# Patient Record
Sex: Male | Born: 1960 | Race: White | Hispanic: No | Marital: Married | Smoking: Current every day smoker
Health system: Southern US, Community
[De-identification: ages and names within clinical notes are randomized; demographics above are authoritative.]

## PROBLEM LIST (undated history)

## (undated) DIAGNOSIS — I10 Essential (primary) hypertension: Secondary | ICD-10-CM

## (undated) HISTORY — PX: BACK SURGERY: SHX140

## (undated) HISTORY — PX: CARDIOVASCULAR STRESS TEST: SHX262

---

## 2006-01-24 ENCOUNTER — Ambulatory Visit: Payer: Self-pay | Admitting: Unknown Physician Specialty

## 2006-02-27 ENCOUNTER — Ambulatory Visit (HOSPITAL_COMMUNITY): Admission: RE | Admit: 2006-02-27 | Discharge: 2006-02-28 | Payer: Self-pay | Admitting: Neurosurgery

## 2006-12-16 ENCOUNTER — Encounter: Admission: RE | Admit: 2006-12-16 | Discharge: 2006-12-16 | Payer: Self-pay | Admitting: Neurosurgery

## 2010-05-19 NOTE — Op Note (Signed)
NAMESIXTO, BOWDISH           ACCOUNT NO.:  [id number]   MEDICAL RECORD NO.:  [id number]          PATIENT TYPE:  AMB   LOCATION:  SDS                          FACILITY:  [facility]   PHYSICIAN:  Hewitt Shorts, M.D.DATE OF BIRTH:  24-Nov-1960   DATE OF PROCEDURE:  02/27/2006  DATE OF DISCHARGE:                               OPERATIVE REPORT   PREOPERATIVE DIAGNOSES:  Right L5-S1 lumbar disk herniation, lumbar  degenerative disk disease, lumbar spondylosis and lumbar radiculopathy.   POSTOPERATIVE DIAGNOSES:  Right L5-S1 lumbar disk herniation, lumbar  degenerative disk disease, lumbar spondylosis and lumbar radiculopathy.   PROCEDURE:  Right L5-S1 lumbar laminotomy and microdiskectomy with  microdissection.   SURGEON:  Hewitt Shorts, M.D.   ASSISTANTS:  Clydene Fake, M.D.  Russell L. Webb Silversmith, R.N.   ANESTHESIA:  General endotracheal.   INDICATIONS:  The patient is a 50 year old man, who presented with a  right lumbar radiculopathy.  He was found to have a right L5-S1 lumbar  disk herniation superimposed upon underlying degenerative disk disease  and spondylosis.  The decision was made to proceed with elective  laminotomy and microdiskectomy.   DESCRIPTION OF PROCEDURE:  The patient was brought to the operating room  and placed under general endotracheal anesthesia.  The patient was  turned to the prone position and the lumbar region was prepped with  Betadine Soap and Solution and draped in a sterile fashion.  The midline  was infiltrated with local anesthetic with epinephrine.  An x-ray was  taken.  The L5-S1 level was identified, and an incision was made over  the L5-S1 level and carried down through the subcutaneous tissue.  Bipolar cautery and electrocautery were used to maintain hemostasis.  Dissection was carried down to the lumbar fascia, which was incised on  the right side of the midline, and the paraspinal muscles were dissected  from the spinous  processes and laminae in a subperiosteal fashion.  A  self-retaining retractor was placed and an x-ray taken and the L5-S1  interlaminar space identified.  The microscope was draped and brought  into the field to provide additional magnification, illumination and  visualization, and the remainder of the decompression was performed  using microdissection and microsurgical technique.  Laminotomy was  performed using the X-Max drill and Kerrison punches.  The ligamentum  flavum was carefully removed.  There was a moderate amount of  spondylotic degeneration from the medial aspect of the L5-S1 facet.  This was carefully dissected from the thecal sac and exiting right S1  nerve root.  We were able to actually mobilize the thecal sac and nerve  root, and there was a disk herniation apparent within a reactive, thin  capsule ventral to the thecal sac and the nerve root.  This was  gradually mobilized and the disk began to extrude, and this was removed  in a piecemeal fashion, and we were able to remove the entire free  fragment of disk herniation with good decompression of the thecal sac  and the nerve root.  The hole in the annulus through which it had exited  had already closed over,  and we were able to free up the remainder of  the reactive membrane.  It was coagulated and divided, and the thecal  sac and nerve root were free in the epidural space.  The diskectomy was  completed with all loose fragments of disk material removed from the  epidural space, and once the diskectomy was completed, hemostasis was  established with the use of Gelfoam soaked in thrombin, as well as  bipolar cautery.  We removed all of the Gelfoam from the epidural space  and laminotomy, and it was felt that hemostasis continued.  And then we  instilled 2 mL of fentanyl and 80 mg of Depo-Medrol into the epidural  space and then proceeded with closure.  The deep fascia was closed with  interrupted, undyed 1 Vicryl  sutures.  Scarpa fascia was closed with  interrupted, undyed, inverted Vicryl sutures, and the subcutaneous,  subcuticular layers closed with interrupted, inverted 2-0 undyed Vicryl  sutures.  The skin edges were approximated with Dermabond.  The  procedure was tolerated well.  The estimated blood loss was 25 mL.  Sponge counts were correct.  Following surgery, the patient was turned  back to a supine position to be reversed from the anesthetic, extubated  and transferred to the recovery room for further care.      Hewitt Shorts, M.D.  Electronically Signed     RWN/MEDQ  D:  02/27/2006  T:  02/27/2006  Job:  [id number]

## 2015-03-11 DIAGNOSIS — E7800 Pure hypercholesterolemia, unspecified: Secondary | ICD-10-CM | POA: Insufficient documentation

## 2015-03-11 DIAGNOSIS — I10 Essential (primary) hypertension: Secondary | ICD-10-CM | POA: Insufficient documentation

## 2015-03-11 DIAGNOSIS — Z720 Tobacco use: Secondary | ICD-10-CM | POA: Insufficient documentation

## 2015-03-14 ENCOUNTER — Telehealth: Payer: Self-pay | Admitting: Gastroenterology

## 2015-03-14 NOTE — Telephone Encounter (Signed)
colonoscopy

## 2015-03-18 ENCOUNTER — Other Ambulatory Visit: Payer: Self-pay

## 2015-03-18 NOTE — Telephone Encounter (Signed)
Gastroenterology Pre-Procedure Review  Request Date: 05/1915 Requesting Physician: Dr. Doy Hutching  PATIENT REVIEW QUESTIONS: The patient responded to the following health history questions as indicated:    1. Are you having any GI issues? no 2. Do you have a personal history of Polyps? no 3. Do you have a family history of Colon Cancer or Polyps? no 4. Diabetes Mellitus? no 5. Joint replacements in the past 12 months?no 6. Major health problems in the past 3 months?no 7. Any artificial heart valves, MVP, or defibrillator?no    MEDICATIONS & ALLERGIES:    Patient reports the following regarding taking any anticoagulation/antiplatelet therapy:   Plavix, Coumadin, Eliquis, Xarelto, Lovenox, Pradaxa, Brilinta, or Effient? no Aspirin? yes (ASA 81mg )  Patient confirms/reports the following medications:  No current outpatient prescriptions on file.   No current facility-administered medications for this visit.    Patient confirms/reports the following allergies:  Allergies not on file  No orders of the defined types were placed in this encounter.    AUTHORIZATION INFORMATION Primary Insurance: 1D#: Group #:  Secondary Insurance: 1D#: Group #:  SCHEDULE INFORMATION: Date: 05/20/15 Time: Location: [place]

## 2015-03-18 NOTE — Telephone Encounter (Signed)
Pt scheduled for a screening colonoscopy at [facility] on 05/20/15. Instructs/rx mailed. Please precert.

## 2015-06-01 NOTE — Discharge Instructions (Signed)

## 2015-06-03 ENCOUNTER — Ambulatory Visit: Payer: BLUE CROSS/BLUE SHIELD | Admitting: Anesthesiology

## 2015-06-03 ENCOUNTER — Ambulatory Visit
Admission: RE | Admit: 2015-06-03 | Discharge: 2015-06-03 | Disposition: A | Discharge status: Home / Self Care | Payer: BLUE CROSS/BLUE SHIELD | Source: Ambulatory Visit | Attending: Gastroenterology | Admitting: Gastroenterology

## 2015-06-03 ENCOUNTER — Encounter
Admission: RE | Disposition: A | Discharge status: Home / Self Care | Payer: Self-pay | Source: Ambulatory Visit | Attending: Gastroenterology

## 2015-06-03 DIAGNOSIS — Z7982 Long term (current) use of aspirin: Secondary | ICD-10-CM | POA: Insufficient documentation

## 2015-06-03 DIAGNOSIS — Z1211 Encounter for screening for malignant neoplasm of colon: Secondary | ICD-10-CM

## 2015-06-03 DIAGNOSIS — Z79899 Other long term (current) drug therapy: Secondary | ICD-10-CM | POA: Insufficient documentation

## 2015-06-03 DIAGNOSIS — D123 Benign neoplasm of transverse colon: Secondary | ICD-10-CM

## 2015-06-03 DIAGNOSIS — K5730 Diverticulosis of large intestine without perforation or abscess without bleeding: Secondary | ICD-10-CM | POA: Insufficient documentation

## 2015-06-03 DIAGNOSIS — F17210 Nicotine dependence, cigarettes, uncomplicated: Secondary | ICD-10-CM | POA: Insufficient documentation

## 2015-06-03 DIAGNOSIS — K640 First degree hemorrhoids: Secondary | ICD-10-CM | POA: Diagnosis not present

## 2015-06-03 DIAGNOSIS — Z9889 Other specified postprocedural states: Secondary | ICD-10-CM | POA: Insufficient documentation

## 2015-06-03 DIAGNOSIS — I10 Essential (primary) hypertension: Secondary | ICD-10-CM | POA: Diagnosis not present

## 2015-06-03 HISTORY — PX: POLYPECTOMY: SHX5525

## 2015-06-03 HISTORY — PX: COLONOSCOPY WITH PROPOFOL: SHX5780

## 2015-06-03 HISTORY — DX: Essential (primary) hypertension: I10

## 2015-06-03 SURGERY — COLONOSCOPY WITH PROPOFOL
Anesthesia: Monitor Anesthesia Care

## 2015-06-03 MED ORDER — SIMETHICONE 40 MG/0.6ML PO SUSP
ORAL | Status: DC | PRN
  Administered 2015-06-03: 09:00:00

## 2015-06-03 MED ORDER — PROPOFOL 10 MG/ML IV BOLUS
INTRAVENOUS | Status: DC | PRN
  Administered 2015-06-03: 100 mg via INTRAVENOUS
  Administered 2015-06-03: 30 mg via INTRAVENOUS
  Administered 2015-06-03: 50 mg via INTRAVENOUS
  Administered 2015-06-03: 20 mg via INTRAVENOUS
  Administered 2015-06-03: 50 mg via INTRAVENOUS

## 2015-06-03 MED ORDER — LACTATED RINGERS IV SOLN
INTRAVENOUS | Status: DC
  Administered 2015-06-03: 08:00:00 via INTRAVENOUS

## 2015-06-03 MED ORDER — LIDOCAINE HCL (CARDIAC) 20 MG/ML IV SOLN
INTRAVENOUS | Status: DC | PRN
  Administered 2015-06-03: 40 mg via INTRAVENOUS

## 2015-06-03 SURGICAL SUPPLY — 22 items
CANISTER SUCT 1200ML W/VALVE (MISCELLANEOUS) ×4 IMPLANT
CLIP HMST 235XBRD CATH ROT (MISCELLANEOUS) IMPLANT
CLIP RESOLUTION 360 11X235 (MISCELLANEOUS)
FCP ESCP3.2XJMB 240X2.8X (MISCELLANEOUS)
FORCEPS BIOP RAD 4 LRG CAP 4 (CUTTING FORCEPS) ×2 IMPLANT
FORCEPS BIOP RJ4 240 W/NDL (MISCELLANEOUS)
FORCEPS ESCP3.2XJMB 240X2.8X (MISCELLANEOUS) IMPLANT
GOWN CVR UNV OPN BCK APRN NK (MISCELLANEOUS) ×4 IMPLANT
GOWN ISOL THUMB LOOP REG UNIV (MISCELLANEOUS) ×8
INJECTOR VARIJECT VIN23 (MISCELLANEOUS) IMPLANT
KIT DEFENDO VALVE AND CONN (KITS) IMPLANT
KIT ENDO PROCEDURE OLY (KITS) ×4 IMPLANT
MARKER SPOT ENDO TATTOO 5ML (MISCELLANEOUS) IMPLANT
PAD GROUND ADULT SPLIT (MISCELLANEOUS) IMPLANT
PROBE APC STR FIRE (PROBE) IMPLANT
SNARE SHORT THROW 13M SML OVAL (MISCELLANEOUS) IMPLANT
SNARE SHORT THROW 30M LRG OVAL (MISCELLANEOUS) IMPLANT
SNARE SNG USE RND 15MM (INSTRUMENTS) IMPLANT
SPOT EX ENDOSCOPIC TATTOO (MISCELLANEOUS)
TRAP ETRAP POLY (MISCELLANEOUS) IMPLANT
VARIJECT INJECTOR VIN23 (MISCELLANEOUS)
WATER STERILE IRR 250ML POUR (IV SOLUTION) ×4 IMPLANT

## 2015-06-03 NOTE — Anesthesia Procedure Notes (Signed)
Procedure Name: MAC Performed by: Alyha Marines Pre-anesthesia Checklist: Patient identified, Emergency Drugs available, Suction available, Patient being monitored and Timeout performed Patient Re-evaluated:Patient Re-evaluated prior to inductionOxygen Delivery Method: Nasal cannula       

## 2015-06-03 NOTE — Op Note (Signed)
[facility] Gastroenterology Patient Name: Norman Weaver Procedure Date: 06/03/2015 8:51 AM MRN: [id number] Account #: [id number] Date of Birth: Mar 13, 1960 Admit Type: Outpatient Age: 55 Room: [facility] OR ROOM 01 Gender: Male Note Status: Finalized Procedure:            Colonoscopy Indications:          Screening for colorectal malignant neoplasm Providers:            Lucilla Lame, MD Referring MD:         Leonie Douglas. Doy Hutching, MD (Referring MD) Medicines:            Propofol per Anesthesia Complications:        No immediate complications. Procedure:            Pre-Anesthesia Assessment:                       - Prior to the procedure, a History and Physical was                        performed, and patient medications and allergies were                        reviewed. The patient's tolerance of previous                        anesthesia was also reviewed. The risks and benefits of                        the procedure and the sedation options and risks were                        discussed with the patient. All questions were                        answered, and informed consent was obtained. Prior                        Anticoagulants: The patient has taken no previous                        anticoagulant or antiplatelet agents. ASA Grade                        Assessment: II - A patient with mild systemic disease.                        After reviewing the risks and benefits, the patient was                        deemed in satisfactory condition to undergo the                        procedure.                       After obtaining informed consent, the colonoscope was                        passed under direct vision. Throughout the procedure,  the patient's blood pressure, pulse, and oxygen                        saturations were monitored continuously. The Olympus CF                        H180AL colonoscope (S#: [id number]) was introduced  through                        the anus and advanced to the the cecum, identified by                        appendiceal orifice and ileocecal valve. The                        colonoscopy was performed without difficulty. The                        patient tolerated the procedure well. The quality of                        the bowel preparation was excellent. Findings:      The perianal and digital rectal examinations were normal.      A 3 mm polyp was found in the transverse colon. The polyp was sessile.       The polyp was removed with a cold biopsy forceps. Resection and       retrieval were complete.      Non-bleeding internal hemorrhoids were found during retroflexion. The       hemorrhoids were Grade I (internal hemorrhoids that do not prolapse).      A few small-mouthed diverticula were found in the sigmoid colon. Impression:           - One 3 mm polyp in the transverse colon, removed with                        a cold biopsy forceps. Resected and retrieved.                       - Non-bleeding internal hemorrhoids.                       - Diverticulosis in the sigmoid colon. Recommendation:       - Await pathology results.                       - Repeat colonoscopy in 5 years if polyp adenoma and 10                        years if hyperplastic Procedure Code(s):    --- Professional ---                       [phone], Colonoscopy, flexible; with biopsy, single or                        multiple Diagnosis Code(s):    --- Professional ---                       Z12.11, Encounter for screening for malignant neoplasm  of colon                       D12.3, Benign neoplasm of transverse colon (hepatic                        flexure or splenic flexure) CPT copyright 2016 American Medical Association. All rights reserved. The codes documented in this report are preliminary and upon coder review may  be revised to meet current compliance requirements. Lucilla Lame,  MD 06/03/2015 9:10:07 AM This report has been signed electronically. Number of Addenda: 0 Note Initiated On: 06/03/2015 8:51 AM Scope Withdrawal Time: 0 hours 6 minutes 27 seconds  Total Procedure Duration: 0 hours 9 minutes 34 seconds       [facility]

## 2015-06-03 NOTE — Anesthesia Preprocedure Evaluation (Addendum)
Anesthesia Evaluation  Patient identified by MRN, date of birth, ID band Patient awake    Reviewed: Allergy & Precautions, NPO status , Patient's Chart, lab work & pertinent test results  Airway Mallampati: II  TM Distance: >3 FB Neck ROM: Full    Dental   Pulmonary neg pulmonary ROS, Current Smoker,    Pulmonary exam normal        Cardiovascular hypertension, negative cardio ROS Normal cardiovascular exam     Neuro/Psych negative neurological ROS  negative psych ROS   GI/Hepatic negative GI ROS, Neg liver ROS,   Endo/Other  negative endocrine ROS  Renal/GU negative Renal ROS  negative genitourinary   Musculoskeletal negative musculoskeletal ROS (+)   Abdominal   Peds negative pediatric ROS (+)  Hematology negative hematology ROS (+)   Anesthesia Other Findings   Reproductive/Obstetrics negative OB ROS                            Anesthesia Physical Anesthesia Plan  ASA: II  Anesthesia Plan: MAC   Post-op Pain Management:    Induction: Intravenous  Airway Management Planned:   Additional Equipment:   Intra-op Plan:   Post-operative Plan:   Informed Consent: I have reviewed the patients History and Physical, chart, labs and discussed the procedure including the risks, benefits and alternatives for the proposed anesthesia with the patient or authorized representative who has indicated his/her understanding and acceptance.     Plan Discussed with: CRNA  Anesthesia Plan Comments:         Anesthesia Quick Evaluation

## 2015-06-03 NOTE — Transfer of Care (Signed)
Immediate Anesthesia Transfer of Care Note  Patient: Norman Weaver  Procedure(s) Performed: Procedure(s): COLONOSCOPY WITH PROPOFOL (N/A) POLYPECTOMY  Patient Location: PACU  Anesthesia Type: MAC  Level of Consciousness: awake, alert  and patient cooperative  Airway and Oxygen Therapy: Patient Spontanous Breathing and Patient connected to supplemental oxygen  Post-op Assessment: Post-op Vital signs reviewed, Patient's Cardiovascular Status Stable, Respiratory Function Stable, Patent Airway and No signs of Nausea or vomiting  Post-op Vital Signs: Reviewed and stable  Complications: No apparent anesthesia complications

## 2015-06-03 NOTE — H&P (Signed)
  [facility]  [address], [place], [place] [address] Phone: [phone] Fax : [phone]  Primary Care Physician:  No primary care provider on file. Primary Gastroenterologist:  Dr. Allen Norris  Pre-Procedure History & Physical: HPI:  Norman Weaver is a 55 y.o. male is here for a screening colonoscopy.   Past Medical History  Diagnosis Date  . Hypertension     Past Surgical History  Procedure Laterality Date  . Back surgery    . Cardiovascular stress test  approx 2014    Prior to Admission medications   Medication Sig Start Date End Date Taking? Authorizing Provider  aspirin 81 MG tablet Take 81 mg by mouth daily.   Yes Historical Provider, MD  lisinopril-hydrochlorothiazide (PRINZIDE,ZESTORETIC) 20-25 MG tablet 1 EVERY MORNING 03/04/15  Yes Historical Provider, MD  simvastatin (ZOCOR) 10 MG tablet Take 10 mg by mouth every evening. 03/04/15  Yes Historical Provider, MD    Allergies as of 03/18/2015  . (No Known Allergies)    History reviewed. No pertinent family history.  Social History   Social History  . Marital Status: Married    Spouse Name: N/A  . Number of Children: N/A  . Years of Education: N/A   Occupational History  . Not on file.   Social History Main Topics  . Smoking status: Current Every Day Smoker -- 1.00 packs/day  . Smokeless tobacco: Current User  . Alcohol Use: 1.8 - 3.6 oz/week    3-6 Cans of beer per week     Comment: daily  . Drug Use: No  . Sexual Activity: Not on file   Other Topics Concern  . Not on file   Social History Narrative    Review of Systems: See HPI, otherwise negative ROS  Physical Exam: BP 137/78 mmHg  Pulse 85  Temp(Src) 98.1 F (36.7 C) (Temporal)  Ht 5\' 9"  (1.753 m)  Wt 190 lb (86.183 kg)  BMI 28.05 kg/m2  SpO2 100% General:   Alert,  pleasant and cooperative in NAD Head:  Normocephalic and atraumatic. Neck:  Supple; no masses or thyromegaly. Lungs:  Clear throughout to  auscultation.    Heart:  Regular rate and rhythm. Abdomen:  Soft, nontender and nondistended. Normal bowel sounds, without guarding, and without rebound.   Neurologic:  Alert and  oriented x4;  grossly normal neurologically.  Impression/Plan: Norman Weaver is now here to undergo a screening colonoscopy.  Risks, benefits, and alternatives regarding colonoscopy have been reviewed with the patient.  Questions have been answered.  All parties agreeable.

## 2015-06-03 NOTE — Anesthesia Postprocedure Evaluation (Signed)
Anesthesia Post Note  Patient: Norman Weaver  Procedure(s) Performed: Procedure(s) (LRB): COLONOSCOPY WITH PROPOFOL (N/A) POLYPECTOMY  Patient location during evaluation: PACU Anesthesia Type: General Level of consciousness: awake and alert Pain management: pain level controlled Vital Signs Assessment: post-procedure vital signs reviewed and stable Respiratory status: spontaneous breathing, nonlabored ventilation, respiratory function stable and patient connected to nasal cannula oxygen Cardiovascular status: blood pressure returned to baseline and stable Postop Assessment: no signs of nausea or vomiting Anesthetic complications: no    Marshell Levan

## 2015-06-06 ENCOUNTER — Encounter: Payer: Self-pay | Admitting: Gastroenterology

## 2015-06-07 ENCOUNTER — Encounter: Payer: Self-pay | Admitting: Gastroenterology

## 2020-01-27 DIAGNOSIS — M7742 Metatarsalgia, left foot: Secondary | ICD-10-CM | POA: Insufficient documentation

## 2020-03-22 ENCOUNTER — Ambulatory Visit: Payer: Self-pay | Admitting: Surgery

## 2020-03-22 NOTE — H&P (View-Only) (Signed)
Subjective:  CC: Peri-rectal abscess [K61.1]   HPI:  Norman Weaver is a 59 y.o. male who was referrred by Jeffrey D Sparks, MD for above. Symptoms were first noted 5 days ago.Pain is sharp.  Improved slightly with prep H and sitz baths.  Lump initially there, but improved now.  No bleeding or drainage. Exacerbated by pressure.  Past Medical History:  has a past medical history of Hypertension.  Past Surgical History:  has a past surgical history that includes back surgery.  Family History: family history includes Lung cancer in his mother; Melanoma in his brother; Myocardial Infarction (Heart attack) in his sister.  Social History:  reports that he has been smoking cigarettes. He has never used smokeless tobacco. He reports current alcohol use. He reports that he does not use drugs.  Current Medications: has a current medication list which includes the following prescription(s): aspirin and lisinopril-hydrochlorothiazide.  Allergies:  Allergies as of 03/22/2020  . (No Known Allergies)    ROS:  A 15 point review of systems was performed and pertinent positives and negatives noted in HPI  Objective:     BP 130/79   Pulse 97   Ht 174 cm (5' 8.5")   Wt 97.5 kg (215 lb)   BMI 32.22 kg/m   Constitutional :  alert, appears stated age, cooperative and no distress  Lymphatics/Throat::  no asymmetry, masses, or scars  Respiratory:  clear to auscultation bilaterally  Cardiovascular:  regular rate and rhythm  Gastrointestinal: soft, non-tender; bowel sounds normal; no masses,  no organomegaly.    Musculoskeletal: Steady gait and movement  Skin: Cool and moist  Psychiatric: Normal affect, non-agitated, not confused  Genital/Rectal: Chaperone present for exam.  External exam noted to have likely perirectal abscess at posterior aspect.  DRE revealed, normal rectal tone, with no obvious hemorrhoid. After obtaining verbal consent, an anoscope was inserted after prepped with lubricant and  confirmed No other pathology such as fissures, fistulas, polyps noted.  Scope withdrawn and patient tolerate procedure well.     LABS:  n/a   RADS: n/a Assessment:     Peri-rectal abscess [K61.1]  Plan:    1. Peri-rectal abscess [K61.1] Discussed risks/benefits/alternatives to I&D.  Alternatives include the options of observation, medical management.  Benefits include symptomatic relief.  I discussed  in detail and the complications related to the operation and the anesthesia, including bleeding, infection, recurrence, remote possibility of temporary or permanent fecal incontinence, poor/delayed wound healing, chronic pain, and additional procedures to address said risks. The risks of general anesthetic, if used, includes MI, CVA, sudden death or even reaction to anesthetic medications also discussed.   We also discussed typical post operative recovery which includes weeks to potentially months of anal pain, drainage, occasional bleeding, and sense of fecal urgency.    ED return precautions given for sudden increase in pain, bleeding, with possible accompanying fever, nausea, and/or vomiting.  The patient understands the risks, any and all questions were answered to the patient's satisfaction.  2. Patient has elected to proceed with surgical treatment. Procedure will be scheduled in a couple days due to recent aspirin use.  Will start abx in the meantime.  If abx significantly improves, we can postpone I&D.  Written consent was obtained.  

## 2020-03-22 NOTE — H&P (Signed)
Subjective:  CC: Peri-rectal abscess [K61.1]   HPI:  Norman Weaver is a 60 y.o. male who was referrred by Idelle Crouch, MD for above. Symptoms were first noted 5 days ago.Pain is sharp.  Improved slightly with prep H and sitz baths.  Lump initially there, but improved now.  No bleeding or drainage. Exacerbated by pressure.  Past Medical History:  has a past medical history of Hypertension.  Past Surgical History:  has a past surgical history that includes back surgery.  Family History: family history includes Lung cancer in his mother; Melanoma in his brother; Myocardial Infarction (Heart attack) in his sister.  Social History:  reports that he has been smoking cigarettes. He has never used smokeless tobacco. He reports current alcohol use. He reports that he does not use drugs.  Current Medications: has a current medication list which includes the following prescription(s): aspirin and lisinopril-hydrochlorothiazide.  Allergies:  Allergies as of 03/22/2020  . (No Known Allergies)    ROS:  A 15 point review of systems was performed and pertinent positives and negatives noted in HPI  Objective:     BP 130/79   Pulse 97   Ht 174 cm (5' 8.5")   Wt 97.5 kg (215 lb)   BMI 32.22 kg/m   Constitutional :  alert, appears stated age, cooperative and no distress  Lymphatics/Throat::  no asymmetry, masses, or scars  Respiratory:  clear to auscultation bilaterally  Cardiovascular:  regular rate and rhythm  Gastrointestinal: soft, non-tender; bowel sounds normal; no masses,  no organomegaly.    Musculoskeletal: Steady gait and movement  Skin: Cool and moist  Psychiatric: Normal affect, non-agitated, not confused  Genital/Rectal: Chaperone present for exam.  External exam noted to have likely perirectal abscess at posterior aspect.  DRE revealed, normal rectal tone, with no obvious hemorrhoid. After obtaining verbal consent, an anoscope was inserted after prepped with lubricant and  confirmed No other pathology such as fissures, fistulas, polyps noted.  Scope withdrawn and patient tolerate procedure well.     LABS:  n/a   RADS: n/a Assessment:     Peri-rectal abscess [K61.1]  Plan:    1. Peri-rectal abscess [K61.1] Discussed risks/benefits/alternatives to I&D.  Alternatives include the options of observation, medical management.  Benefits include symptomatic relief.  I discussed  in detail and the complications related to the operation and the anesthesia, including bleeding, infection, recurrence, remote possibility of temporary or permanent fecal incontinence, poor/delayed wound healing, chronic pain, and additional procedures to address said risks. The risks of general anesthetic, if used, includes MI, CVA, sudden death or even reaction to anesthetic medications also discussed.   We also discussed typical post operative recovery which includes weeks to potentially months of anal pain, drainage, occasional bleeding, and sense of fecal urgency.    ED return precautions given for sudden increase in pain, bleeding, with possible accompanying fever, nausea, and/or vomiting.  The patient understands the risks, any and all questions were answered to the patient's satisfaction.  2. Patient has elected to proceed with surgical treatment. Procedure will be scheduled in a couple days due to recent aspirin use.  Will start abx in the meantime.  If abx significantly improves, we can postpone I&D.  Written consent was obtained.

## 2020-03-23 ENCOUNTER — Other Ambulatory Visit
Admission: RE | Admit: 2020-03-23 | Discharge: 2020-03-23 | Disposition: A | Discharge status: Home / Self Care | Payer: BC Managed Care – PPO | Source: Ambulatory Visit | Attending: Surgery | Admitting: Surgery

## 2020-03-23 ENCOUNTER — Other Ambulatory Visit: Payer: Self-pay

## 2020-03-23 DIAGNOSIS — Z20822 Contact with and (suspected) exposure to covid-19: Secondary | ICD-10-CM | POA: Insufficient documentation

## 2020-03-23 DIAGNOSIS — K612 Anorectal abscess: Secondary | ICD-10-CM | POA: Diagnosis not present

## 2020-03-23 DIAGNOSIS — F17200 Nicotine dependence, unspecified, uncomplicated: Secondary | ICD-10-CM | POA: Diagnosis not present

## 2020-03-23 DIAGNOSIS — K640 First degree hemorrhoids: Secondary | ICD-10-CM | POA: Diagnosis not present

## 2020-03-23 DIAGNOSIS — Z01812 Encounter for preprocedural laboratory examination: Secondary | ICD-10-CM | POA: Insufficient documentation

## 2020-03-23 DIAGNOSIS — Z8249 Family history of ischemic heart disease and other diseases of the circulatory system: Secondary | ICD-10-CM | POA: Diagnosis not present

## 2020-03-23 DIAGNOSIS — I10 Essential (primary) hypertension: Secondary | ICD-10-CM | POA: Diagnosis not present

## 2020-03-23 LAB — SARS CORONAVIRUS 2 (TAT 6-24 HRS): SARS Coronavirus 2: NEGATIVE

## 2020-03-24 ENCOUNTER — Ambulatory Visit: Payer: BC Managed Care – PPO | Admitting: Anesthesiology

## 2020-03-24 ENCOUNTER — Encounter: Payer: Self-pay | Admitting: Surgery

## 2020-03-24 ENCOUNTER — Ambulatory Visit
Admission: RE | Admit: 2020-03-24 | Discharge: 2020-03-24 | Disposition: A | Discharge status: Home / Self Care | Payer: BC Managed Care – PPO | Attending: Surgery | Admitting: Surgery

## 2020-03-24 ENCOUNTER — Encounter
Admission: RE | Disposition: A | Discharge status: Home / Self Care | Payer: Self-pay | Source: Home / Self Care | Attending: Surgery

## 2020-03-24 DIAGNOSIS — K612 Anorectal abscess: Secondary | ICD-10-CM | POA: Insufficient documentation

## 2020-03-24 DIAGNOSIS — K640 First degree hemorrhoids: Secondary | ICD-10-CM | POA: Insufficient documentation

## 2020-03-24 DIAGNOSIS — F17200 Nicotine dependence, unspecified, uncomplicated: Secondary | ICD-10-CM | POA: Insufficient documentation

## 2020-03-24 DIAGNOSIS — Z20822 Contact with and (suspected) exposure to covid-19: Secondary | ICD-10-CM | POA: Insufficient documentation

## 2020-03-24 DIAGNOSIS — Z8249 Family history of ischemic heart disease and other diseases of the circulatory system: Secondary | ICD-10-CM | POA: Insufficient documentation

## 2020-03-24 DIAGNOSIS — K611 Rectal abscess: Secondary | ICD-10-CM

## 2020-03-24 DIAGNOSIS — I10 Essential (primary) hypertension: Secondary | ICD-10-CM | POA: Insufficient documentation

## 2020-03-24 HISTORY — PX: INCISION AND DRAINAGE ABSCESS: SHX5864

## 2020-03-24 SURGERY — INCISION AND DRAINAGE, ABSCESS
Anesthesia: General | Site: Rectum

## 2020-03-24 MED ORDER — DEXAMETHASONE SODIUM PHOSPHATE 10 MG/ML IJ SOLN
INTRAMUSCULAR | Status: AC
  Filled 2020-03-24: qty 1

## 2020-03-24 MED ORDER — ONDANSETRON HCL 4 MG/2ML IJ SOLN
INTRAMUSCULAR | Status: DC | PRN
  Administered 2020-03-24: 4 mg via INTRAVENOUS

## 2020-03-24 MED ORDER — ONDANSETRON HCL 4 MG/2ML IJ SOLN
INTRAMUSCULAR | Status: AC
  Filled 2020-03-24: qty 2

## 2020-03-24 MED ORDER — LACTATED RINGERS IV SOLN: INTRAVENOUS | Status: DC

## 2020-03-24 MED ORDER — GABAPENTIN 300 MG PO CAPS: 300.0000 mg | ORAL_CAPSULE | ORAL | Status: AC

## 2020-03-24 MED ORDER — MIDAZOLAM HCL 2 MG/2ML IJ SOLN
INTRAMUSCULAR | Status: AC
  Filled 2020-03-24: qty 2

## 2020-03-24 MED ORDER — HYDROCODONE-ACETAMINOPHEN 7.5-325 MG PO TABS: 1.0000 | ORAL_TABLET | Freq: Once | ORAL | Status: DC | PRN

## 2020-03-24 MED ORDER — CELECOXIB 200 MG PO CAPS
ORAL_CAPSULE | ORAL | Status: AC
  Administered 2020-03-24: 200 mg via ORAL
  Filled 2020-03-24: qty 1

## 2020-03-24 MED ORDER — DEXMEDETOMIDINE (PRECEDEX) IN NS 20 MCG/5ML (4 MCG/ML) IV SYRINGE
PREFILLED_SYRINGE | INTRAVENOUS | Status: DC | PRN
  Administered 2020-03-24 (×2): 4 ug via INTRAVENOUS

## 2020-03-24 MED ORDER — FENTANYL CITRATE (PF) 100 MCG/2ML IJ SOLN: 25.0000 ug | INTRAMUSCULAR | Status: DC | PRN

## 2020-03-24 MED ORDER — PHENYLEPHRINE HCL (PRESSORS) 10 MG/ML IV SOLN
INTRAVENOUS | Status: DC | PRN
  Administered 2020-03-24 (×2): 100 ug via INTRAVENOUS
  Administered 2020-03-24 (×2): 50 ug via INTRAVENOUS

## 2020-03-24 MED ORDER — LIDOCAINE HCL (PF) 2 % IJ SOLN
INTRAMUSCULAR | Status: AC
  Filled 2020-03-24: qty 5

## 2020-03-24 MED ORDER — GABAPENTIN 300 MG PO CAPS
ORAL_CAPSULE | ORAL | Status: AC
  Administered 2020-03-24: 300 mg via ORAL
  Filled 2020-03-24: qty 1

## 2020-03-24 MED ORDER — PROPOFOL 10 MG/ML IV BOLUS
INTRAVENOUS | Status: DC | PRN
  Administered 2020-03-24: 200 mg via INTRAVENOUS

## 2020-03-24 MED ORDER — ACETAMINOPHEN 500 MG PO TABS: 1000.0000 mg | ORAL_TABLET | ORAL | Status: AC

## 2020-03-24 MED ORDER — PROMETHAZINE HCL 25 MG/ML IJ SOLN: 6.2500 mg | INTRAMUSCULAR | Status: DC | PRN

## 2020-03-24 MED ORDER — MIDAZOLAM HCL 2 MG/2ML IJ SOLN
INTRAMUSCULAR | Status: DC | PRN
  Administered 2020-03-24: 2 mg via INTRAVENOUS

## 2020-03-24 MED ORDER — PROPOFOL 10 MG/ML IV BOLUS
INTRAVENOUS | Status: AC
  Filled 2020-03-24: qty 20

## 2020-03-24 MED ORDER — CHLORHEXIDINE GLUCONATE 0.12 % MT SOLN
15.0000 mL | Freq: Once | OROMUCOSAL | Status: AC
  Administered 2020-03-24: 15 mL via OROMUCOSAL

## 2020-03-24 MED ORDER — FENTANYL CITRATE (PF) 100 MCG/2ML IJ SOLN
INTRAMUSCULAR | Status: DC | PRN
  Administered 2020-03-24: 50 ug via INTRAVENOUS
  Administered 2020-03-24 (×2): 25 ug via INTRAVENOUS

## 2020-03-24 MED ORDER — LIDOCAINE HCL 1 % IJ SOLN
INTRAMUSCULAR | Status: DC | PRN
  Administered 2020-03-24: 4 mL

## 2020-03-24 MED ORDER — ACETAMINOPHEN 160 MG/5ML PO SOLN
325.0000 mg | ORAL | Status: DC | PRN
  Filled 2020-03-24: qty 20.3

## 2020-03-24 MED ORDER — CHLORHEXIDINE GLUCONATE 0.12 % MT SOLN
OROMUCOSAL | Status: AC
  Filled 2020-03-24: qty 15

## 2020-03-24 MED ORDER — KETOROLAC TROMETHAMINE 30 MG/ML IJ SOLN: 30.0000 mg | Freq: Once | INTRAMUSCULAR | Status: DC | PRN

## 2020-03-24 MED ORDER — CELECOXIB 200 MG PO CAPS: 200.0000 mg | ORAL_CAPSULE | ORAL | Status: AC

## 2020-03-24 MED ORDER — CHLORHEXIDINE GLUCONATE CLOTH 2 % EX PADS: 6.0000 | MEDICATED_PAD | Freq: Once | CUTANEOUS | Status: DC

## 2020-03-24 MED ORDER — ACETAMINOPHEN 500 MG PO TABS
ORAL_TABLET | ORAL | Status: AC
  Administered 2020-03-24: 1000 mg via ORAL
  Filled 2020-03-24: qty 2

## 2020-03-24 MED ORDER — ORAL CARE MOUTH RINSE: 15.0000 mL | Freq: Once | OROMUCOSAL | Status: AC

## 2020-03-24 MED ORDER — ACETAMINOPHEN 325 MG PO TABS: 325.0000 mg | ORAL_TABLET | ORAL | Status: DC | PRN

## 2020-03-24 MED ORDER — DEXAMETHASONE SODIUM PHOSPHATE 10 MG/ML IJ SOLN
INTRAMUSCULAR | Status: DC | PRN
  Administered 2020-03-24: 10 mg via INTRAVENOUS

## 2020-03-24 MED ORDER — BUPIVACAINE-EPINEPHRINE 0.25% -1:200000 IJ SOLN
INTRAMUSCULAR | Status: DC | PRN
  Administered 2020-03-24: 4 mL

## 2020-03-24 MED ORDER — FENTANYL CITRATE (PF) 100 MCG/2ML IJ SOLN
INTRAMUSCULAR | Status: AC
  Filled 2020-03-24: qty 2

## 2020-03-24 MED ORDER — LIDOCAINE HCL (CARDIAC) PF 100 MG/5ML IV SOSY
PREFILLED_SYRINGE | INTRAVENOUS | Status: DC | PRN
  Administered 2020-03-24: 100 mg via INTRAVENOUS

## 2020-03-24 SURGICAL SUPPLY — 29 items
APL PRP STRL LF DISP 70% ISPRP (MISCELLANEOUS) ×1
BLADE SURG 15 STRL LF DISP TIS (BLADE) ×1 IMPLANT
BLADE SURG 15 STRL SS (BLADE) ×2
CANISTER SUCT 1200ML W/VALVE (MISCELLANEOUS) ×1 IMPLANT
CHLORAPREP W/TINT 26 (MISCELLANEOUS) ×2 IMPLANT
COVER WAND RF STERILE (DRAPES) ×2 IMPLANT
DRAPE LAPAROTOMY 100X77 ABD (DRAPES) ×2 IMPLANT
ELECT REM PT RETURN 9FT ADLT (ELECTROSURGICAL) ×2
ELECTRODE REM PT RTRN 9FT ADLT (ELECTROSURGICAL) ×1 IMPLANT
GAUZE PACKING IODOFORM 1/2 (PACKING) IMPLANT
GAUZE SPONGE 4X4 12PLY STRL (GAUZE/BANDAGES/DRESSINGS) ×2 IMPLANT
GLOVE SURG SYN 6.5 ES PF (GLOVE) ×6 IMPLANT
GLOVE SURG SYN 6.5 PF PI (GLOVE) ×2 IMPLANT
GLOVE SURG UNDER POLY LF SZ7 (GLOVE) ×4 IMPLANT
GOWN STRL REUS W/ TWL LRG LVL3 (GOWN DISPOSABLE) ×2 IMPLANT
GOWN STRL REUS W/TWL LRG LVL3 (GOWN DISPOSABLE) ×6
LABEL OR SOLS (LABEL) ×2 IMPLANT
MANIFOLD NEPTUNE II (INSTRUMENTS) ×2 IMPLANT
NEEDLE HYPO 22GX1.5 SAFETY (NEEDLE) ×2 IMPLANT
NS IRRIG 500ML POUR BTL (IV SOLUTION) ×2 IMPLANT
PACK BASIN MINOR ARMC (MISCELLANEOUS) ×2 IMPLANT
SUT MNCRL 4-0 (SUTURE)
SUT MNCRL 4-0 27XMFL (SUTURE)
SUT VIC AB 2-0 CT2 27 (SUTURE) ×1 IMPLANT
SUT VIC AB 3-0 SH 27 (SUTURE)
SUT VIC AB 3-0 SH 27X BRD (SUTURE) ×1 IMPLANT
SUTURE MNCRL 4-0 27XMF (SUTURE) ×1 IMPLANT
SYR 10ML LL (SYRINGE) ×2 IMPLANT
TOWEL OR 17X26 4PK STRL BLUE (TOWEL DISPOSABLE) ×1 IMPLANT

## 2020-03-24 NOTE — Interval H&P Note (Signed)
History and Physical Interval Note:  03/24/2020 9:54 AM  Norman Weaver  has presented today for surgery, with the diagnosis of Peri-rectal abscess K61.1.    Notified by patient last night about the abscess spontaneously draining.  Reinspection of the area in office noted a ulcerated area with active drainage, tenderness to palpation, and an adequate examination at an office bedside to definitively confirm adequate drainage as well as absence of fistula.  Due to the concern this may become a recurrent issue for fistula was the cause of this abscess, I still recommended proceeding with exam under anesthesia in the operating room, debridement of the abscess cavity  The various methods of treatment have been discussed with the patient and family. After consideration of risks, benefits and other options for treatment, the patient has consented to  Procedure(s): INCISION AND DRAINAGE ABSCESS (N/A) as a surgical intervention.  The patient's history has been reviewed, patient examined, no change in status, stable for surgery.  I have reviewed the patient's chart and labs.  Questions were answered to the patient's satisfaction.     Oluwaseun Cremer Lysle Pearl

## 2020-03-24 NOTE — Anesthesia Preprocedure Evaluation (Signed)
Anesthesia Evaluation  Patient identified by MRN, date of birth, ID band Patient awake    Reviewed: Allergy & Precautions, H&P , NPO status , reviewed documented beta blocker date and time   Airway Mallampati: II  TM Distance: >3 FB Neck ROM: full    Dental  (+) Caps   Pulmonary Current Smoker and Patient abstained from smoking.,    Pulmonary exam normal        Cardiovascular hypertension, Normal cardiovascular exam     Neuro/Psych    GI/Hepatic neg GERD  ,Rare GERD, Rolaids as needed, no sx's today or during last week   Endo/Other    Renal/GU      Musculoskeletal   Abdominal   Peds  Hematology   Anesthesia Other Findings Past Medical History: No date: Hypertension Past Surgical History: No date: BACK SURGERY approx 2014: CARDIOVASCULAR STRESS TEST 06/03/2015: COLONOSCOPY WITH PROPOFOL; N/A     Comment:  Procedure: COLONOSCOPY WITH PROPOFOL;  Surgeon: Lucilla Lame, MD;  Location: [facility];  Service:               Endoscopy;  Laterality: N/A; 06/03/2015: POLYPECTOMY     Comment:  Procedure: POLYPECTOMY;  Surgeon: Lucilla Lame, MD;                Location: [place];  Service: Endoscopy;; BMI    Body Mass Index: 31.01 kg/m     Reproductive/Obstetrics                             Anesthesia Physical Anesthesia Plan  ASA: II  Anesthesia Plan: General   Post-op Pain Management:    Induction: Intravenous  PONV Risk Score and Plan: 1 and Ondansetron, Treatment may vary due to age or medical condition and Midazolam  Airway Management Planned: LMA  Additional Equipment:   Intra-op Plan:   Post-operative Plan: Extubation in OR  Informed Consent: I have reviewed the patients History and Physical, chart, labs and discussed the procedure including the risks, benefits and alternatives for the proposed anesthesia with the patient or authorized  representative who has indicated his/her understanding and acceptance.     Dental Advisory Given  Plan Discussed with: CRNA  Anesthesia Plan Comments:         Anesthesia Quick Evaluation

## 2020-03-24 NOTE — Transfer of Care (Signed)
Immediate Anesthesia Transfer of Care Note  Patient: Norman Weaver  Procedure(s) Performed: INCISION AND DRAINAGE ABSCESS (N/A Rectum)  Patient Location: PACU  Anesthesia Type:General  Level of Consciousness: awake, alert  and oriented  Airway & Oxygen Therapy: Patient Spontanous Breathing and Patient connected to face mask oxygen  Post-op Assessment: Report given to RN and Post -op Vital signs reviewed and stable  Post vital signs: Reviewed and stable  Last Vitals:  Vitals Value Taken Time  BP 112/68 03/24/20 1103  Temp 36.6 C 03/24/20 1103  Pulse 77 03/24/20 1105  Resp 15 03/24/20 1105  SpO2 100 % 03/24/20 1105  Vitals shown include unvalidated device data.  Last Pain:  Vitals:   03/24/20 1004  PainSc: 2          Complications: No complications documented.

## 2020-03-24 NOTE — Op Note (Signed)
Preoperative diagnosis: Anal abscess Postoperative diagnosis: Anal abscess, grade 1 internal hemorrhoid  Procedure: Exam under anesthesia, debridement of anal abscess  Anesthesia: LMA  Surgeon: Lysle Pearl  Wound Classification: Clean Contaminated  Specimen: None  Complications: None  EBL: 3 mL  Preoperative wound measurement: 3 cm x 2 cm x 3.5 cm deep Postoperative wound measurement: 3 cm x 2 cm x 3.5 cm deep Amount of debrided tissue: 6 cm  Indications:  Patient is a 60 y.o. male with clinical exam concerning for an anal abscess.  Here today for formal exam under anesthesia.  See H&P for further details.  Description of procedure: The patient was taken to the operating room and placed in high lithotomy position.  LMA anesthesia was induced without any difficulty. A time-out was completed verifying correct patient, procedure, site, positioning, and implant(s) and/or special equipment prior to beginning this procedure.  Digital rectal exam noted no obvious indurated tissue or palpable fistulous tracts within the rectum.  He did have some grade 1 internal hemorrhoids.  Visualization within the lumen shortly afterwards confirmed the above findings.    Overlying necrotic tissue of the anal abscess cavity adjacent to the anal verge was removed with cautery.  Probing of the abscess cavity noted no extensive tunneling or additional tracks, but was adjacent to the rectal tissue.  Cavity itself remained within the subcutaneous layer and did not involve any muscle.  Hydrogen peroxide was then instilled into the abscess cavity while monitoring for any bubbling within the rectum.  No obvious bubbling was noted within the rectum confirming no fistulous tracts were present at the time of exam.     After confirming hemostasis, wound irrigated, packed with half inch iodoform packing and then cover with 4 x 4's, secured with mesh undergarments.  The patient tolerated the procedure well and  taken to the  postoperative care unit in stable condition.  Sponge and instrument count was correct at the end of the procedure.

## 2020-03-24 NOTE — Discharge Instructions (Addendum)
INCISION AND DRAINAGE, Care After This sheet gives you information about how to care for yourself after your procedure. Your health care provider may also give you more specific instructions. If you have problems or questions, contact your health care provider. What can I expect after the procedure? After the procedure, it is common to have:  Soreness.  Bruising.  Itching. Follow these instructions at home: site care Follow instructions from your health care provider about how to take care of your site. Make sure you:  Wash your hands with soap and water before and after you change your bandage (dressing). If soap and water are not available, use hand sanitizer.  Leave packing in wound for 48hrs, then ok to remove.  Sitz baths twice a day until wound heals.  Use sanitary pads as needed for the occasional bloody or clear discharge.  If the area bleeds or bruises, apply gentle pressure for 10 minutes.  OK TO SHOWER IN 24HRS  Check your site every day for signs of infection. Check for:  Redness, swelling, or pain.  Access thick brown fluid or blood.  Warmth.  Pus or a bad smell.  General instructions . Okay to return activities as tolerated. Marland Kitchen  tylenol and advil as needed for discomfort.  Please alternate between the two every four hours as needed for pain.   .  Use narcotics, if prescribed, only when tylenol and motrin is not enough to control pain. .  325-650mg  every 8hrs to max of 3000mg /24hrs (including the 325mg  in every norco dose) for the tylenol.   .  Advil up to 800mg  per dose every 8hrs as needed for pain.    Keep all follow-up visits as told by your health care provider. This is important. Contact a health care provider if:  You have redness, swelling, or pain around your site.  You have fluid or blood coming from your site.  Your site feels warm to the touch.  You have pus or a bad smell coming from your site.  You have a fever.  Your sutures, skin glue,  or adhesive strips loosen or come off sooner than expected. Get help right away if:  You have bleeding that does not stop with pressure or a dressing. Summary  After the procedure, it is common to have some soreness, bruising, and itching at the site.  Follow instructions from your health care provider about how to take care of your site.  Check your site every day for signs of infection.  Contact a health care provider if you have redness, swelling, or pain around your site, or your site feels warm to the touch.  Keep all follow-up visits as told by your health care provider. This is important. This information is not intended to replace advice given to you by your health care provider. Make sure you discuss any questions you have with your health care provider. Document Released: 01/14/2015 Document Revised: 06/17/2017 Document Reviewed: 06/17/2017 Elsevier Interactive Patient Education  2019 [employer]   1) The drugs that you were given will stay in your system until tomorrow so for the next 24 hours you should not:  A) Drive an automobile B) Make any legal decisions C) Drink any alcoholic beverage   2) You may resume regular meals tomorrow.  Today it is better to start with liquids and gradually work up to solid foods.  You may eat anything you prefer, but it is better to start with liquids,  then soup and crackers, and gradually work up to solid foods.   3) Please notify your doctor immediately if you have any unusual bleeding, trouble breathing, redness and pain at the surgery site, drainage, fever, or pain not relieved by medication.    4) Additional Instructions:   Keep green band on for 4 days   Please contact your physician with any problems or Same Day Surgery at [phone], Monday through Friday 6 am to 4 pm, or [place] at [facility] number at [phone].

## 2020-03-24 NOTE — Anesthesia Procedure Notes (Signed)
Procedure Name: LMA Insertion Date/Time: 03/24/2020 10:29 AM Performed by: Lily Peer, Felica Chargois, CRNA Pre-anesthesia Checklist: Patient identified, Emergency Drugs available, Suction available and Patient being monitored Patient Re-evaluated:Patient Re-evaluated prior to induction Oxygen Delivery Method: Circle system utilized Preoxygenation: Pre-oxygenation with 100% oxygen Induction Type: IV induction Ventilation: Mask ventilation without difficulty LMA: LMA inserted LMA Size: 4.0 Number of attempts: 1 Placement Confirmation: positive ETCO2 and breath sounds checked- equal and bilateral Tube secured with: Tape Dental Injury: Teeth and Oropharynx as per pre-operative assessment

## 2020-03-31 NOTE — Anesthesia Postprocedure Evaluation (Signed)
Anesthesia Post Note  Patient: Norman Weaver  Procedure(s) Performed: INCISION AND DRAINAGE ABSCESS (N/A Rectum)  Patient location during evaluation: PACU Anesthesia Type: General Level of consciousness: awake and alert Pain management: pain level controlled Vital Signs Assessment: post-procedure vital signs reviewed and stable Respiratory status: spontaneous breathing, nonlabored ventilation and respiratory function stable Cardiovascular status: blood pressure returned to baseline and stable Postop Assessment: no apparent nausea or vomiting Anesthetic complications: no   No complications documented.   Last Vitals:  Vitals:   03/24/20 1140 03/24/20 1148  BP: 114/72 116/66  Pulse: 72 73  Resp: 15 16  Temp: 36.8 C 36.9 C  SpO2: 96% 98%    Last Pain:  Vitals:   03/24/20 1148  TempSrc: Oral  PainSc: 0-No pain                 Alphonsus Sias

## 2020-04-04 ENCOUNTER — Telehealth: Payer: Self-pay | Admitting: *Deleted

## 2020-04-04 NOTE — Telephone Encounter (Signed)
Received referral for low dose lung cancer screening CT scan. Message left at phone number listed in EMR for patient to call me back to facilitate scheduling scan.  

## 2020-04-05 ENCOUNTER — Encounter: Payer: Self-pay | Admitting: *Deleted

## 2020-04-05 ENCOUNTER — Telehealth: Payer: Self-pay | Admitting: *Deleted

## 2020-04-05 DIAGNOSIS — Z122 Encounter for screening for malignant neoplasm of respiratory organs: Secondary | ICD-10-CM

## 2020-04-05 DIAGNOSIS — Z87891 Personal history of nicotine dependence: Secondary | ICD-10-CM

## 2020-04-05 DIAGNOSIS — F17200 Nicotine dependence, unspecified, uncomplicated: Secondary | ICD-10-CM

## 2020-04-05 NOTE — Telephone Encounter (Signed)
Patient is currently an everyday smoker.

## 2020-04-05 NOTE — Telephone Encounter (Signed)
Received referral for initial lung cancer screening scan. Contacted patient and obtained smoking history,(1 ppd x 40 years) as well as answering questions related to screening process. Patient denies signs of lung cancer such as weight loss or hemoptysis. Patient denies comorbidity that would prevent curative treatment if lung cancer were found. Patient is scheduled for shared decision making visit and CT scan on 04/20/20 at 10:00am.

## 2020-04-20 ENCOUNTER — Ambulatory Visit
Admission: RE | Admit: 2020-04-20 | Discharge: 2020-04-20 | Disposition: A | Discharge status: Home / Self Care | Payer: BC Managed Care – PPO | Source: Ambulatory Visit | Attending: Oncology | Admitting: Oncology

## 2020-04-20 ENCOUNTER — Inpatient Hospital Stay: Payer: BC Managed Care – PPO | Attending: Oncology | Admitting: Oncology

## 2020-04-20 ENCOUNTER — Other Ambulatory Visit: Payer: Self-pay

## 2020-04-20 DIAGNOSIS — Z87891 Personal history of nicotine dependence: Secondary | ICD-10-CM | POA: Diagnosis not present

## 2020-04-20 DIAGNOSIS — F17200 Nicotine dependence, unspecified, uncomplicated: Secondary | ICD-10-CM | POA: Insufficient documentation

## 2020-04-20 DIAGNOSIS — Z122 Encounter for screening for malignant neoplasm of respiratory organs: Secondary | ICD-10-CM | POA: Insufficient documentation

## 2020-04-20 NOTE — Progress Notes (Signed)
Virtual Visit via Video Note  I connected with Mr. Debono on 04/20/20 at  9:45 AM EDT by a video enabled telemedicine application and verified that I am speaking with the correct person using two identifiers.  Location: Patient: [facility]   Provider: Clinic    I discussed the limitations of evaluation and management by telemedicine and the availability of in person appointments. The patient expressed understanding and agreed to proceed.  I discussed the assessment and treatment plan with the patient. The patient was provided an opportunity to ask questions and all were answered. The patient agreed with the plan and demonstrated an understanding of the instructions.   The patient was advised to call back or seek an in-person evaluation if the symptoms worsen or if the condition fails to improve as anticipated.   In accordance with CMS guidelines, patient has met eligibility criteria including age, absence of signs or symptoms of lung cancer.  Social History   Tobacco Use  . Smoking status: Current Every Day Smoker    Packs/day: 1.00    Years: 40.00    Pack years: 40.00  . Smokeless tobacco: Current User  Substance Use Topics  . Alcohol use: Yes    Alcohol/week: 3.0 - 6.0 standard drinks    Types: 3 - 6 Cans of beer per week    Comment: daily  . Drug use: No      A shared decision-making session was conducted prior to the performance of CT scan. This includes one or more decision aids, includes benefits and harms of screening, follow-up diagnostic testing, over-diagnosis, false positive rate, and total radiation exposure.   Counseling on the importance of adherence to annual lung cancer LDCT screening, impact of co-morbidities, and ability or willingness to undergo diagnosis and treatment is imperative for compliance of the program.   Counseling on the importance of continued smoking cessation for former smokers; the importance of smoking cessation for current smokers, and information  about tobacco cessation interventions have been given to patient including [place] and [facility] quit [place] programs.   Written order for lung cancer screening with LDCT has been given to the patient and any and all questions have been answered to the best of my abilities.    Yearly follow up will be coordinated by Burgess Estelle, Thoracic Navigator.  I provided 15 minutes of face-to-face video visit time during this encounter, and > 50% was spent counseling as documented under my assessment & plan.   Jacquelin Hawking, NP

## 2020-04-28 ENCOUNTER — Encounter: Payer: Self-pay | Admitting: *Deleted

## 2021-05-04 ENCOUNTER — Telehealth: Payer: Self-pay | Admitting: Acute Care

## 2021-05-04 NOTE — Telephone Encounter (Signed)
Attempted to reach pt to schedule LDCT-LVVM ?

## 2021-08-14 ENCOUNTER — Other Ambulatory Visit: Payer: Self-pay | Admitting: Internal Medicine

## 2021-08-14 DIAGNOSIS — F17210 Nicotine dependence, cigarettes, uncomplicated: Secondary | ICD-10-CM

## 2021-08-30 ENCOUNTER — Ambulatory Visit
Admission: RE | Admit: 2021-08-30 | Discharge: 2021-08-30 | Disposition: A | Discharge status: Home / Self Care | Payer: BC Managed Care – PPO | Source: Ambulatory Visit | Attending: Internal Medicine | Admitting: Internal Medicine

## 2021-08-30 DIAGNOSIS — F17210 Nicotine dependence, cigarettes, uncomplicated: Secondary | ICD-10-CM

## 2021-09-15 IMAGING — CT CT CHEST LUNG CANCER SCREENING LOW DOSE W/O CM
2 of 5 series · 15 of 40 positions shown, 18 images · non-contrast
Comparison: None.

CLINICAL DATA: 59-year-old male with 40 pack-year history of
smoking. Lung cancer screening.

EXAM:
CT CHEST WITHOUT CONTRAST LOW-DOSE FOR LUNG CANCER SCREENING
TECHNIQUE: Multidetector CT imaging of the chest was performed following the
standard protocol without IV contrast.

[Series 3: lung 1.00 · axial · 0.68mm/px · z∈[-1161,-878]mm · 12 of 313 slices shown, 15 images]
[im 15/313  mediastinal]
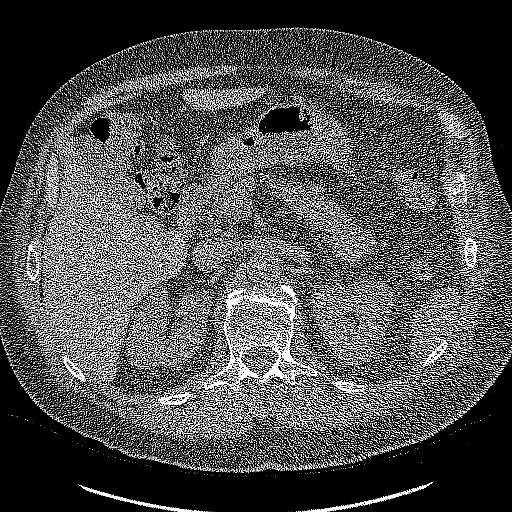
[im 15/313  lung]
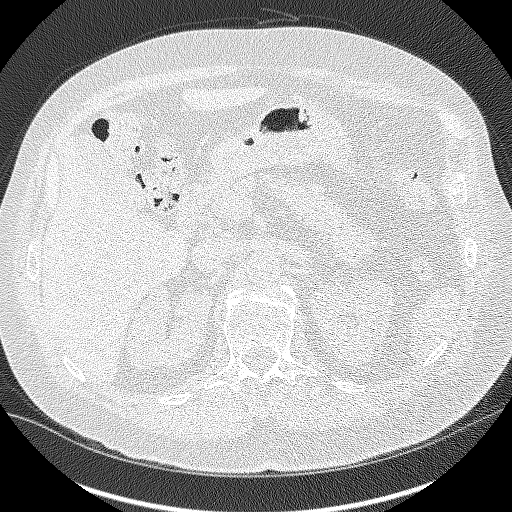
[im 45/313  lung]
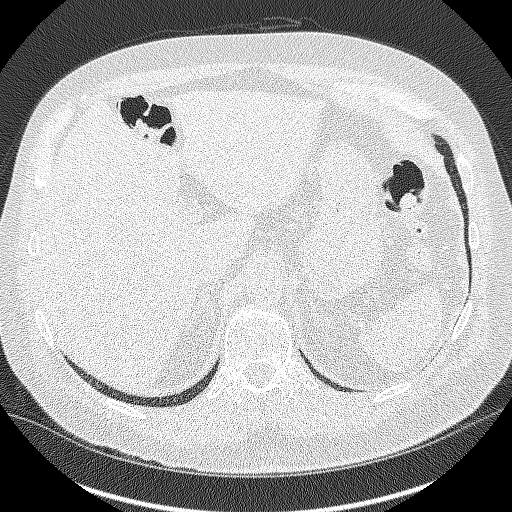
[im 75/313  lung]
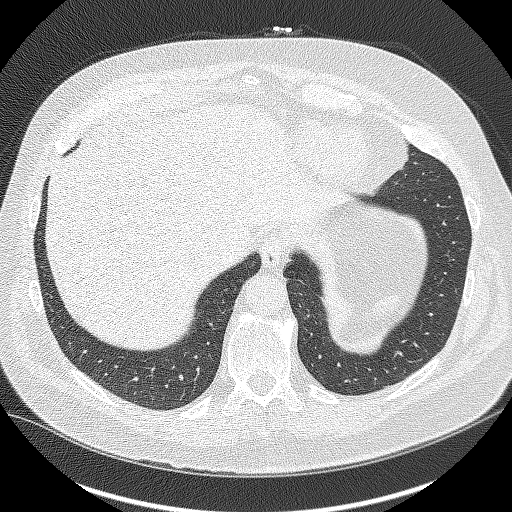
[im 90/313  lung]
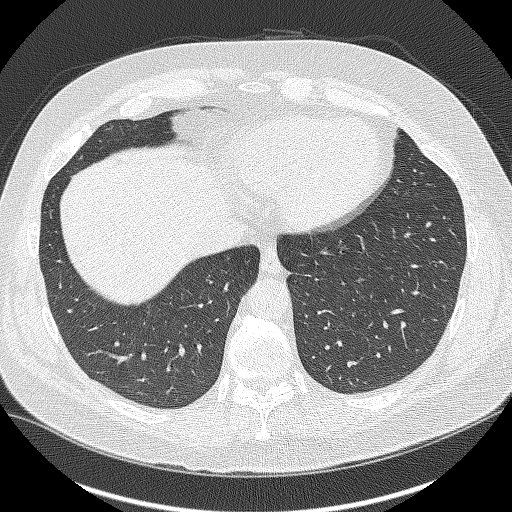
[im 119/313  mediastinal]
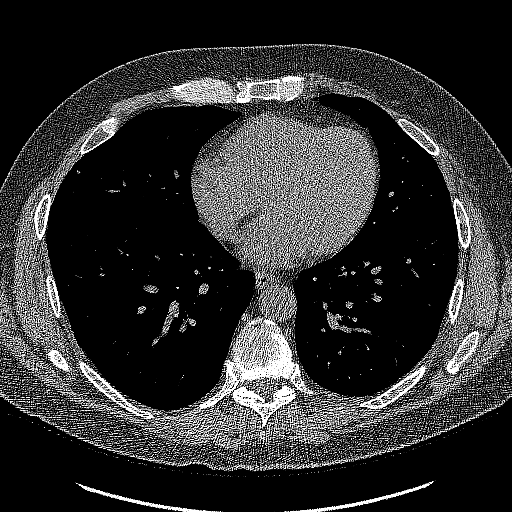
[im 119/313  lung]
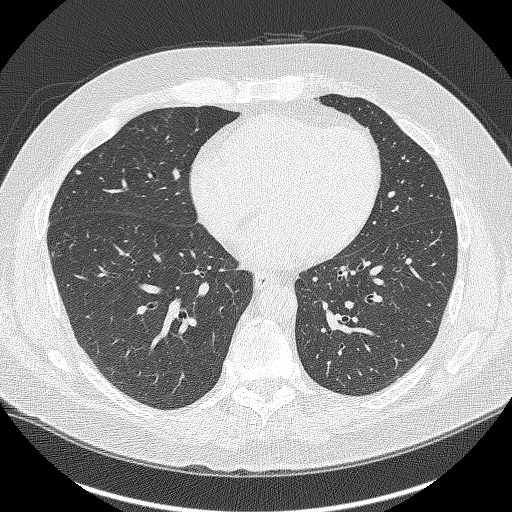
[im 149/313  lung]
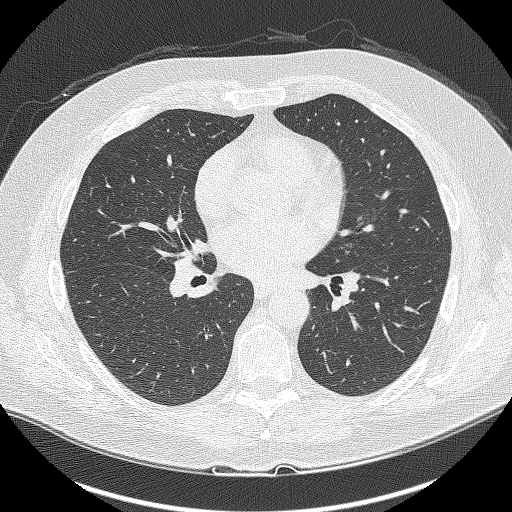
[im 164/313  lung]
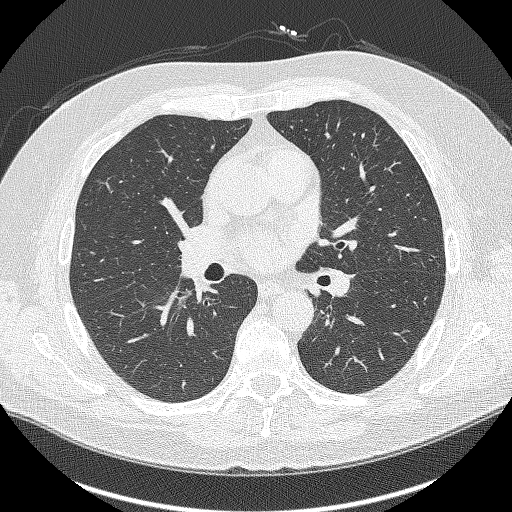
[im 194/313  lung]
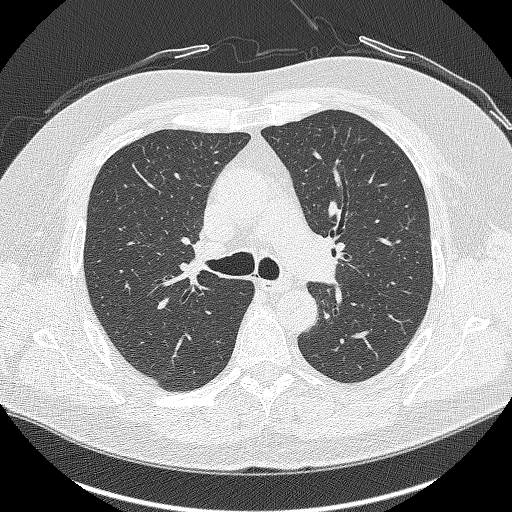
[im 223/313  mediastinal]
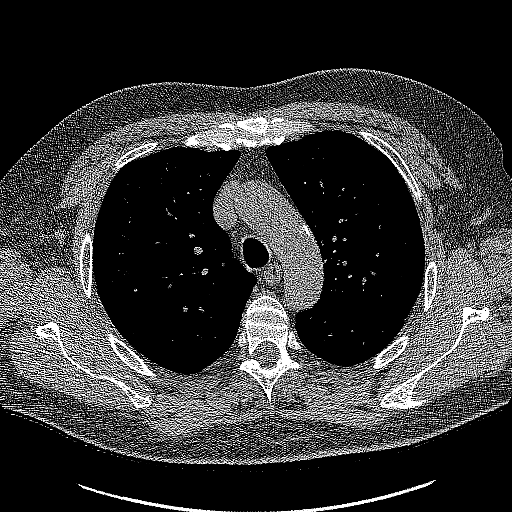
[im 223/313  lung]
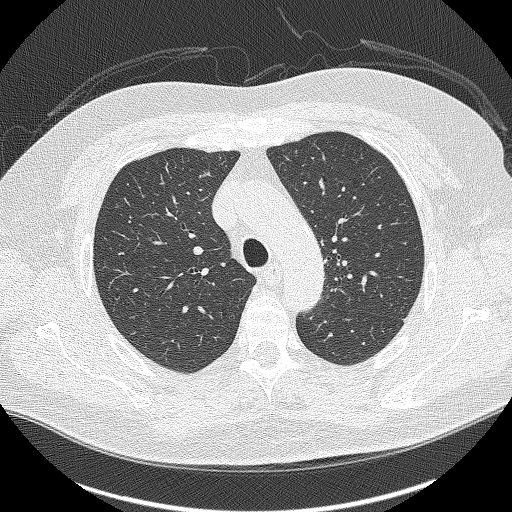
[im 238/313  lung]
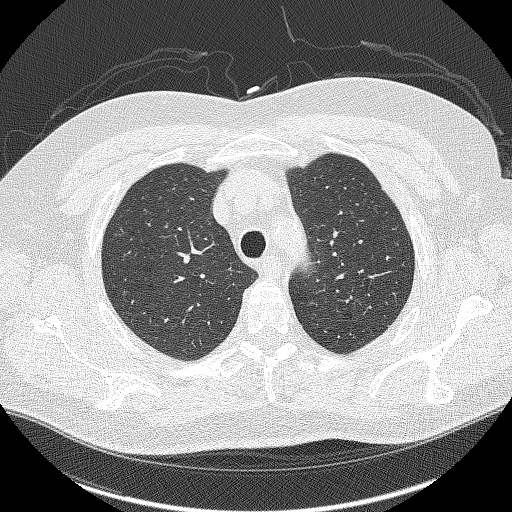
[im 268/313  lung]
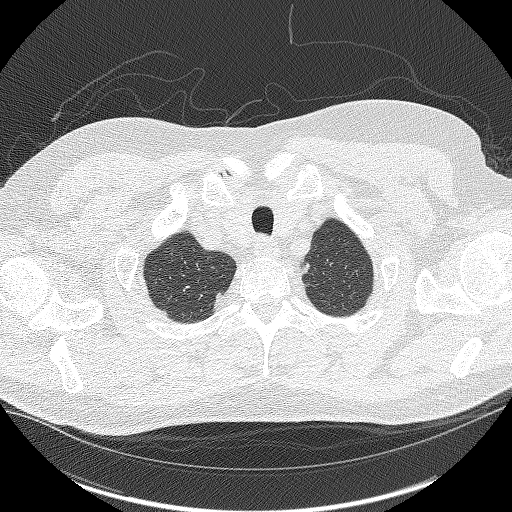
[im 298/313  lung]
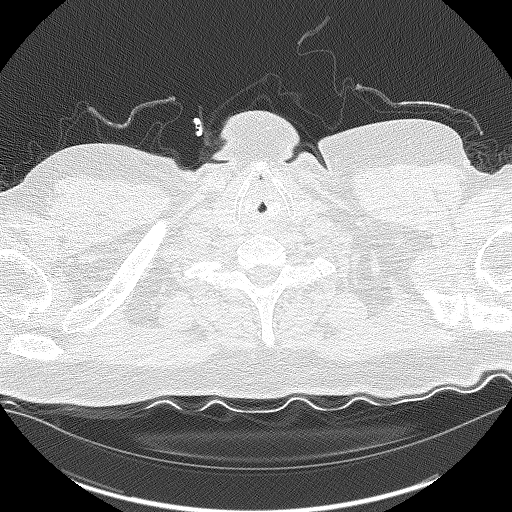

[Series 5: coronals lung 1.00 cor · coronal · 0.61mm/px · 3 of 301 slices shown]
[im 61/301  lung]
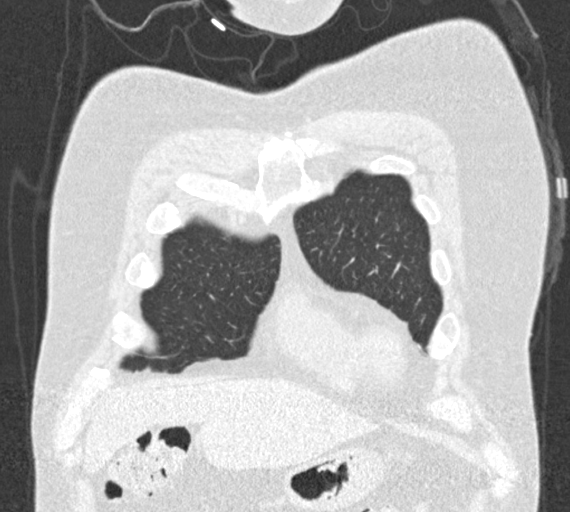
[im 121/301  lung]
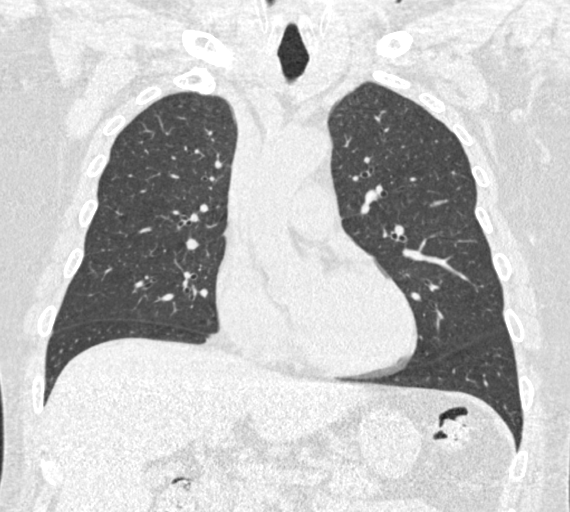
[im 181/301  lung]
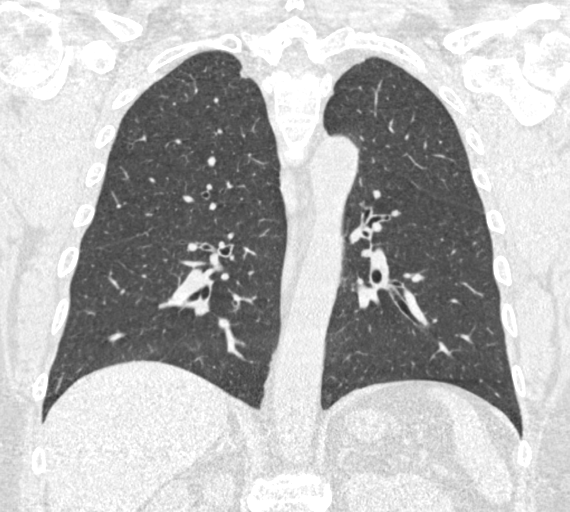

[15 of 40 positions shown; findings below may reference images not displayed]

FINDINGS: Cardiovascular: The heart size is normal. No substantial pericardial
effusion. Coronary artery calcification is evident. Atherosclerotic
calcification is noted in the wall of the thoracic aorta.

Mediastinum/Nodes: No mediastinal lymphadenopathy. No evidence for
gross hilar lymphadenopathy although assessment is limited by the
lack of intravenous contrast on today's study. The esophagus has
normal imaging features. There is no axillary lymphadenopathy.

Lungs/Pleura: Centrilobular emphsyema noted. Tiny scattered
bilateral pulmonary nodules are identified ranging in size from
mm up to 4.0 mm. No overtly suspicious nodule or mass on today's
study. No focal airspace consolidation. No pleural effusion.

Upper Abdomen: Unremarkable.

Musculoskeletal: No worrisome lytic or sclerotic osseous
abnormality.
IMPRESSION: 1. Lung-RADS 2, benign appearance or behavior. Continue annual
screening with low-dose chest CT without contrast in 12 months.
2.  Emphysema (YIWO7-3QA.1) and Aortic Atherosclerosis (YIWO7-170.0)

## 2021-10-11 ENCOUNTER — Encounter: Payer: Self-pay | Admitting: Podiatry

## 2021-10-11 ENCOUNTER — Ambulatory Visit: Payer: BC Managed Care – PPO | Admitting: Podiatry

## 2021-10-11 DIAGNOSIS — L600 Ingrowing nail: Secondary | ICD-10-CM

## 2021-10-11 DIAGNOSIS — M5412 Radiculopathy, cervical region: Secondary | ICD-10-CM | POA: Insufficient documentation

## 2021-10-11 DIAGNOSIS — M7550 Bursitis of unspecified shoulder: Secondary | ICD-10-CM | POA: Insufficient documentation

## 2021-10-11 MED ORDER — NEOMYCIN-POLYMYXIN-HC 1 % OT SOLN: OTIC | 1 refills | Status: AC

## 2021-10-11 NOTE — Progress Notes (Signed)
  Subjective:  Patient ID: Norman Weaver, male    DOB: 11-18-1960,  MRN: [id number] HPI Chief Complaint  Patient presents with   Toe Pain    Hallux bilateral - both borders, ingrown, right lateral border is red, swollen and very tender x 2 weeks, usually can trim out, kitten also grabbed the same corner with its claw-made worse, tried soaking salt water   New Patient (Initial Visit)    61 y.o. male presents with the above complaint.   ROS: Denies fever chills nausea vomit muscle aches pains calf pain back pain chest pain shortness of breath.  Past Medical History:  Diagnosis Date   Hypertension    Past Surgical History:  Procedure Laterality Date   BACK SURGERY     CARDIOVASCULAR STRESS TEST  approx 2014   COLONOSCOPY WITH PROPOFOL N/A 06/03/2015   Procedure: COLONOSCOPY WITH PROPOFOL;  Surgeon: Lucilla Lame, MD;  Location: [place];  Service: Endoscopy;  Laterality: N/A;   INCISION AND DRAINAGE ABSCESS N/A 03/24/2020   Procedure: INCISION AND DRAINAGE ABSCESS;  Surgeon: Benjamine Sprague, DO;  Location: [facility];  Service: General;  Laterality: N/A;   POLYPECTOMY  06/03/2015   Procedure: POLYPECTOMY;  Surgeon: Lucilla Lame, MD;  Location: [place];  Service: Endoscopy;;    Current Outpatient Medications:    NEOMYCIN-POLYMYXIN-HYDROCORTISONE (CORTISPORIN) 1 % SOLN OTIC solution, Apply 1-2 drops to toe BID after soaking, Disp: 10 mL, Rfl: 1   ibuprofen (ADVIL) 200 MG tablet, Take 600 mg by mouth every 6 (six) hours as needed (Inflammation)., Disp: , Rfl:    lisinopril-hydrochlorothiazide (PRINZIDE,ZESTORETIC) 20-25 MG tablet, Take 1 tablet by mouth daily., Disp: , Rfl: 0  No Known Allergies Review of Systems Objective:  There were no vitals filed for this visit.  General: Well developed, nourished, in no acute distress, alert and oriented x3   Dermatological: Skin is warm, dry and supple bilateral. Nails x 10 are well maintained; remaining integument appears  unremarkable at this time. There are no open sores, no preulcerative lesions, no rash or signs of infection present.  Sharp and abraded nail margin with erythema and abscess to the distal lateral aspect of the fibular border right.  Exquisitely tender on palpation.  Vascular: Dorsalis Pedis artery and Posterior Tibial artery pedal pulses are 2/4 bilateral with immedate capillary fill time. Pedal hair growth present. No varicosities and no lower extremity edema present bilateral.   Neruologic: Grossly intact via light touch bilateral. Vibratory intact via tuning fork bilateral. Protective threshold with Semmes Wienstein monofilament intact to all pedal sites bilateral. Patellar and Achilles deep tendon reflexes 2+ bilateral. No Babinski or clonus noted bilateral.   Musculoskeletal: No gross boney pedal deformities bilateral. No pain, crepitus, or limitation noted with foot and ankle range of motion bilateral. Muscular strength 5/5 in all groups tested bilateral.  Gait: Unassisted, Nonantalgic.    Radiographs:  Radiographs not taken  Assessment & Plan:   Assessment: Ingrown toenail fibular border hallux right  Plan: Chemical matricectomy fibular border hallux right.  He was given both oral and home-going instructions care and soaking of the toe as well as a prescription for Cortisporin Otic to be applied twice daily after soaking.  Follow-up with him in 2 to 3 weeks     Genni Buske T. [place], [place]

## 2021-10-11 NOTE — Patient Instructions (Signed)

## 2021-11-08 ENCOUNTER — Encounter: Payer: Self-pay | Admitting: Podiatry

## 2021-11-08 ENCOUNTER — Ambulatory Visit (INDEPENDENT_AMBULATORY_CARE_PROVIDER_SITE_OTHER): Payer: BC Managed Care – PPO | Admitting: Podiatry

## 2021-11-08 DIAGNOSIS — L600 Ingrowing nail: Secondary | ICD-10-CM

## 2021-11-08 DIAGNOSIS — Z9889 Other specified postprocedural states: Secondary | ICD-10-CM

## 2021-11-08 NOTE — Progress Notes (Signed)
He presents today for follow-up of his right hallux.  He states that is doing good is just a little tender still but much better than it was.  Objective: Vital signs are stable he is alert and oriented x3.  Pulses are palpable.  Hallux fibular border right demonstrates mild erythema no edema cellulitis drainage or odor no purulence on palpation.  Assessment: Well-healing matrixectomy hallux right.  Plan: Encouraged him to soak every other day Epsom salts and warm water until the tenderness has resolved.  Remember to ask how his mother-in-law was doing she had lung cancer that metastasized to her brain.

## 2022-07-19 ENCOUNTER — Other Ambulatory Visit: Payer: Self-pay | Admitting: Internal Medicine

## 2022-07-19 DIAGNOSIS — F17210 Nicotine dependence, cigarettes, uncomplicated: Secondary | ICD-10-CM

## 2022-07-19 DIAGNOSIS — Z122 Encounter for screening for malignant neoplasm of respiratory organs: Secondary | ICD-10-CM

## 2022-07-19 DIAGNOSIS — Z0000 Encounter for general adult medical examination without abnormal findings: Secondary | ICD-10-CM

## 2022-07-19 DIAGNOSIS — Z720 Tobacco use: Secondary | ICD-10-CM

## 2022-07-30 ENCOUNTER — Ambulatory Visit
Admission: RE | Admit: 2022-07-30 | Discharge: 2022-07-30 | Disposition: A | Discharge status: Home / Self Care | Payer: BC Managed Care – PPO | Source: Ambulatory Visit | Attending: Internal Medicine | Admitting: Internal Medicine

## 2022-07-30 DIAGNOSIS — F17210 Nicotine dependence, cigarettes, uncomplicated: Secondary | ICD-10-CM | POA: Diagnosis present

## 2022-07-30 DIAGNOSIS — Z720 Tobacco use: Secondary | ICD-10-CM | POA: Insufficient documentation

## 2022-07-30 DIAGNOSIS — Z0000 Encounter for general adult medical examination without abnormal findings: Secondary | ICD-10-CM | POA: Diagnosis present

## 2022-07-30 DIAGNOSIS — Z122 Encounter for screening for malignant neoplasm of respiratory organs: Secondary | ICD-10-CM | POA: Insufficient documentation

## 2022-10-12 ENCOUNTER — Ambulatory Visit
Admission: EM | Admit: 2022-10-12 | Discharge: 2022-10-12 | Disposition: A | Discharge status: Home / Self Care | Payer: BC Managed Care – PPO | Attending: Physician Assistant | Admitting: Physician Assistant

## 2022-10-12 ENCOUNTER — Encounter: Payer: Self-pay | Admitting: Emergency Medicine

## 2022-10-12 DIAGNOSIS — R051 Acute cough: Secondary | ICD-10-CM | POA: Diagnosis not present

## 2022-10-12 DIAGNOSIS — Z1152 Encounter for screening for COVID-19: Secondary | ICD-10-CM | POA: Diagnosis not present

## 2022-10-12 DIAGNOSIS — R0981 Nasal congestion: Secondary | ICD-10-CM | POA: Insufficient documentation

## 2022-10-12 DIAGNOSIS — J069 Acute upper respiratory infection, unspecified: Secondary | ICD-10-CM | POA: Insufficient documentation

## 2022-10-12 LAB — SARS CORONAVIRUS 2 BY RT PCR: SARS Coronavirus 2 by RT PCR: NEGATIVE

## 2022-10-12 NOTE — ED Triage Notes (Signed)
Patient reports runny nose, nasal congestion, cough and chest congestion that started 4 days ago.  Patient reports low grade fevers.

## 2022-10-12 NOTE — ED Provider Notes (Signed)
[facility]    CSN: [id number] Arrival date & time: 10/12/22  0959      History   Chief Complaint Chief Complaint  Patient presents with   Nasal Congestion   Cough    HPI Norman Weaver is a 62 y.o. male presenting for 4 day history of cough and congestion. Reports feeling feverish and recording a temp of 100.3 degrees yesterday. Also had a mild sore throat at onset. Denies fatigue, body aches, headaches, chest pain, wheezing, shortness of breath, diarrhea or vomiting. No sick contacts. Has been taking OTC allergy meds with improvement in symptoms.   HPI  Past Medical History:  Diagnosis Date   Hypertension     Patient Active Problem List   Diagnosis Date Noted   Bursitis of shoulder 10/11/2021   Cervical radiculitis 10/11/2021   Metatarsalgia of left foot 01/27/2020   Special screening for malignant neoplasms, colon    Benign neoplasm of transverse colon    Pure hypercholesterolemia 03/11/2015   HTN, goal below 140/80 03/11/2015   Current tobacco use 03/11/2015    Past Surgical History:  Procedure Laterality Date   BACK SURGERY     CARDIOVASCULAR STRESS TEST  approx 2014   COLONOSCOPY WITH PROPOFOL N/A 06/03/2015   Procedure: COLONOSCOPY WITH PROPOFOL;  Surgeon: Midge Minium, MD;  Location: [facility];  Service: Endoscopy;  Laterality: N/A;   INCISION AND DRAINAGE ABSCESS N/A 03/24/2020   Procedure: INCISION AND DRAINAGE ABSCESS;  Surgeon: Sung Amabile, DO;  Location: [facility];  Service: General;  Laterality: N/A;   POLYPECTOMY  06/03/2015   Procedure: POLYPECTOMY;  Surgeon: Midge Minium, MD;  Location: [facility];  Service: Endoscopy;;       Home Medications    Prior to Admission medications   Medication Sig Start Date End Date Taking? Authorizing Provider  lisinopril-hydrochlorothiazide (PRINZIDE,ZESTORETIC) 20-25 MG tablet Take 1 tablet by mouth daily. 03/04/15  Yes [provider]  omeprazole (PRILOSEC) 40 MG capsule  Take 40 mg by mouth daily. 12/20/21 12/20/22 Yes [provider]  ibuprofen (ADVIL) 200 MG tablet Take 600 mg by mouth every 6 (six) hours as needed (Inflammation).    [provider]  NEOMYCIN-POLYMYXIN-HYDROCORTISONE (CORTISPORIN) 1 % SOLN OTIC solution Apply 1-2 drops to toe BID after soaking 10/11/21   Hyatt, Max T, DPM    Family History History reviewed. No pertinent family history.  Social History Social History   Tobacco Use   Smoking status: Every Day    Current packs/day: 1.00    Average packs/day: 1 pack/day for 40.0 years (40.0 ttl pk-yrs)    Types: Cigarettes   Smokeless tobacco: Current  Vaping Use   Vaping status: Never Used  Substance Use Topics   Alcohol use: Yes    Alcohol/week: 3.0 - 6.0 standard drinks of alcohol    Types: 3 - 6 Cans of beer per week    Comment: daily   Drug use: No     Allergies   Patient has no known allergies.   Review of Systems Review of Systems  Constitutional:  Positive for fatigue and fever.  HENT:  Positive for congestion and rhinorrhea. Negative for sinus pressure, sinus pain and sore throat.   Respiratory:  Positive for cough. Negative for shortness of breath.   Cardiovascular:  Negative for chest pain.  Gastrointestinal:  Negative for abdominal pain, diarrhea, nausea and vomiting.  Musculoskeletal:  Negative for myalgias.  Neurological:  Negative for weakness, light-headedness and headaches.  Hematological:  Negative  for adenopathy.     Physical Exam Triage Vital Signs ED Triage Vitals  Encounter Vitals Group     BP      Systolic BP Percentile      Diastolic BP Percentile      Pulse      Resp      Temp      Temp src      SpO2      Weight      Height      Head Circumference      Peak Flow      Pain Score      Pain Loc      Pain Education      Exclude from Growth Chart    No data found.  Updated Vital Signs BP (!) 141/85 (BP Location: Left Arm)   Pulse 78   Temp 98.9 F (37.2 C)  (Oral)   Resp 15   Ht 5' 9.5" (1.765 m)   Wt 210 lb (95.3 kg)   SpO2 100%   BMI 30.57 kg/m     Physical Exam Vitals and nursing note reviewed.  Constitutional:      General: He is not in acute distress.    Appearance: Normal appearance. He is well-developed. He is not ill-appearing.  HENT:     Head: Normocephalic and atraumatic.     Nose: Congestion present.     Mouth/Throat:     Mouth: Mucous membranes are moist.     Pharynx: Oropharynx is clear. Posterior oropharyngeal erythema present.  Eyes:     General: No scleral icterus.    Conjunctiva/sclera: Conjunctivae normal.  Cardiovascular:     Rate and Rhythm: Normal rate and regular rhythm.     Heart sounds: Normal heart sounds.  Pulmonary:     Effort: Pulmonary effort is normal. No respiratory distress.     Breath sounds: Normal breath sounds.  Musculoskeletal:     Cervical back: Neck supple.  Skin:    General: Skin is warm and dry.     Capillary Refill: Capillary refill takes less than 2 seconds.  Neurological:     General: No focal deficit present.     Mental Status: He is alert. Mental status is at baseline.     Motor: No weakness.     Gait: Gait normal.  Psychiatric:        Mood and Affect: Mood normal.        Behavior: Behavior normal.      UC Treatments / Results  Labs (all labs ordered are listed, but only abnormal results are displayed) Labs Reviewed  SARS CORONAVIRUS 2 BY RT PCR    EKG   Radiology No results found.  Procedures Procedures (including critical care time)  Medications Ordered in UC Medications - No data to display  Initial Impression / Assessment and Plan / UC Course  I have reviewed the triage vital signs and the nursing notes.  Pertinent labs & imaging results that were available during my care of the patient were reviewed by me and considered in my medical decision making (see chart for details).   62 y/o male presents for 4 day history of low grade fever, cough and  congestion. Denies shortness of breath.   He is afebrile. Overall well appearing. Has mild nasal congestion and posterior pharyngeal erythema. Chest is CTA and heart RRR.   PCR COVID test obtained.  Negative.  Viral URI.  Offered prescription cough medication but he declined stating he will  continue with OTC meds.  Supportive care advised.  Explained that he may expect to be sick for a week or 2.  Reviewed return and ED precautions.   Final Clinical Impressions(s) / UC Diagnoses   Final diagnoses:  Viral upper respiratory tract infection  Acute cough  Nasal congestion     Discharge Instructions      URI/COLD SYMPTOMS: Your exam today is consistent with a viral illness. Antibiotics are not indicated at this time. Use medications as directed, including cough syrup, nasal saline, and decongestants. Your symptoms should improve over the next few days and resolve within 7-10 days. Increase rest and fluids. F/u if symptoms worsen or predominate such as sore throat, ear pain, productive cough, shortness of breath, or if you develop high fevers or worsening fatigue over the next several days.       ED Prescriptions   None    PDMP not reviewed this encounter.   Shirlee Latch, PA-C 10/12/22 [phone]

## 2022-10-12 NOTE — Discharge Instructions (Signed)
URI/COLD SYMPTOMS: Your exam today is consistent with a viral illness. Antibiotics are not indicated at this time. Use medications as directed, including cough syrup, nasal saline, and decongestants. Your symptoms should improve over the next few days and resolve within 7-10 days. Increase rest and fluids. F/u if symptoms worsen or predominate such as sore throat, ear pain, productive cough, shortness of breath, or if you develop high fevers or worsening fatigue over the next several days.    

## 2023-05-31 ENCOUNTER — Ambulatory Visit: Admitting: Urology

## 2023-05-31 ENCOUNTER — Encounter: Payer: Self-pay | Admitting: Urology

## 2023-05-31 VITALS — BP 153/83 | HR 85 | Ht 68.0 in | Wt 210.0 lb

## 2023-05-31 DIAGNOSIS — R300 Dysuria: Secondary | ICD-10-CM | POA: Diagnosis not present

## 2023-05-31 DIAGNOSIS — R399 Unspecified symptoms and signs involving the genitourinary system: Secondary | ICD-10-CM

## 2023-05-31 DIAGNOSIS — R3912 Poor urinary stream: Secondary | ICD-10-CM | POA: Diagnosis not present

## 2023-05-31 LAB — URINALYSIS, COMPLETE
Bilirubin, UA: NEGATIVE
Glucose, UA: NEGATIVE
Ketones, UA: NEGATIVE
Leukocytes,UA: NEGATIVE
Nitrite, UA: NEGATIVE
Protein,UA: NEGATIVE
RBC, UA: NEGATIVE
Specific Gravity, UA: 1.015 (ref 1.005–1.030)
Urobilinogen, Ur: 0.2 mg/dL (ref 0.2–1.0)
pH, UA: 6.5 (ref 5.0–7.5)

## 2023-05-31 LAB — MICROSCOPIC EXAMINATION: Bacteria, UA: NONE SEEN

## 2023-05-31 MED ORDER — TAMSULOSIN HCL 0.4 MG PO CAPS
0.8000 mg | ORAL_CAPSULE | Freq: Every day | ORAL | 0 refills | Status: DC
Start: 2023-05-31 — End: 2023-07-01

## 2023-05-31 NOTE — Patient Instructions (Signed)

## 2023-05-31 NOTE — Progress Notes (Signed)
 I, Norman Weaver, acting as a Neurosurgeon for Norman Knapp, MD., have documented all relevant documentation on the behalf of Norman Knapp, MD, as directed by Norman Knapp, MD while in the presence of Norman Knapp, MD.  Discussed the use of AI scribe software for clinical note transcription with the patient, who gave verbal consent to proceed.    05/31/2023 1:37 PM   Norman Weaver 03-20-1960 [id number]  Referring provider: Yehuda Helms, MD [address] [facility] [place],  [place] [address]  Chief Complaint  Patient presents with   Recurrent UTI    HPI: Norman Weaver is a 63 y.o. male referred for evaluation of UTI symptoms.  Reports an irritating sensation at the penile meatus with mild dysuria. Feels some restriction when voiding. No gross hematuria noted. Previously given a trial of tamsulosin  by Norman Weaver without significant improvement in symptoms. Took Azo for burning, which helped alleviate symptoms. No prior history of urologic problems. PSA in June 2024 was 1.48   PMH: Past Medical History:  Diagnosis Date   Hypertension     Surgical History: Past Surgical History:  Procedure Laterality Date   BACK SURGERY     CARDIOVASCULAR STRESS TEST  approx 2014   COLONOSCOPY WITH PROPOFOL  N/A 06/03/2015   Procedure: COLONOSCOPY WITH PROPOFOL ;  Surgeon: Norman Sink, MD;  Location: [facility];  Service: Endoscopy;  Laterality: N/A;   INCISION AND DRAINAGE ABSCESS N/A 03/24/2020   Procedure: INCISION AND DRAINAGE ABSCESS;  Surgeon: Norman Delay, DO;  Location: [facility];  Service: General;  Laterality: N/A;   POLYPECTOMY  06/03/2015   Procedure: POLYPECTOMY;  Surgeon: Norman Sink, MD;  Location: [facility];  Service: Endoscopy;;    Home Medications:  Allergies as of 05/31/2023   No Known Allergies      Medication List        Accurate as of May 31, 2023  1:37 PM. If you have any questions, ask your nurse or  doctor.          STOP taking these medications    omeprazole 40 MG capsule Commonly known as: PRILOSEC Stopped by: Norman Weaver       TAKE these medications    ibuprofen 200 MG tablet Commonly known as: ADVIL Take 600 mg by mouth every 6 (six) hours as needed (Inflammation).   lisinopril-hydrochlorothiazide 20-25 MG tablet Commonly known as: ZESTORETIC Take 1 tablet by mouth daily.   NEOMYCIN -POLYMYXIN-HYDROCORTISONE 1 % Soln OTIC solution Commonly known as: CORTISPORIN Apply 1-2 drops to toe BID after soaking   tamsulosin  0.4 MG Caps capsule Commonly known as: FLOMAX  Take 2 capsules (0.8 mg total) by mouth daily. What changed:  how much to take when to take this Changed by: Norman Weaver        Allergies: No Known Allergies  Social History:  reports that he has been smoking cigarettes. He has a 40 pack-year smoking history. He uses smokeless tobacco. He reports current alcohol use of about 3.0 - 6.0 standard drinks of alcohol per week. He reports that he does not use drugs.   Physical Exam: BP (!) 153/83   Pulse 85   Ht 5\' 8"  (1.727 m)   Wt 210 lb (95.3 kg)   BMI 31.93 kg/m   Constitutional:  Alert and oriented, No acute distress. HEENT: [place] AT Respiratory: Normal respiratory effort, no increased work of breathing. GU: Phallus circumcised without lesions. Meatus appears normal in  size. Testes descended bilaterally without masses or tenderness. Psychiatric: Normal mood and affect.  Urinalysis Dipstick/microscopy negative   Assessment & Plan:    1. Lower urinary tract symptoms Mild obstructive voiding symptoms No improvement on tamsulosin , and will titrate to 0.8 mg daily.  Tentatively schedule cystoscopy in 1 month if symptoms are persistent.   2. Dysuria As above.  I have reviewed the above documentation for accuracy and completeness, and I agree with the above.   Norman Knapp, MD  [facility] [address], [address] [place], [place] [address] [phone]

## 2023-06-01 MED ORDER — DIAZEPAM 5 MG PO TABS: ORAL_TABLET | ORAL | 0 refills | Status: AC

## 2023-06-07 ENCOUNTER — Ambulatory Visit: Admitting: Urology

## 2023-06-30 ENCOUNTER — Other Ambulatory Visit: Payer: Self-pay | Admitting: Urology

## 2023-06-30 DIAGNOSIS — R300 Dysuria: Secondary | ICD-10-CM

## 2023-06-30 DIAGNOSIS — R3912 Poor urinary stream: Secondary | ICD-10-CM

## 2023-07-22 ENCOUNTER — Other Ambulatory Visit: Payer: Self-pay | Admitting: Internal Medicine

## 2023-07-22 DIAGNOSIS — O99334 Smoking (tobacco) complicating childbirth: Secondary | ICD-10-CM

## 2023-07-22 DIAGNOSIS — Z0000 Encounter for general adult medical examination without abnormal findings: Secondary | ICD-10-CM

## 2023-07-22 DIAGNOSIS — Z122 Encounter for screening for malignant neoplasm of respiratory organs: Secondary | ICD-10-CM

## 2023-08-05 ENCOUNTER — Ambulatory Visit
Admission: RE | Admit: 2023-08-05 | Discharge: 2023-08-05 | Disposition: A | Discharge status: Home / Self Care | Source: Ambulatory Visit | Attending: Internal Medicine | Admitting: Internal Medicine

## 2023-08-05 DIAGNOSIS — Z0000 Encounter for general adult medical examination without abnormal findings: Secondary | ICD-10-CM | POA: Diagnosis present

## 2023-08-05 DIAGNOSIS — O99334 Smoking (tobacco) complicating childbirth: Secondary | ICD-10-CM | POA: Insufficient documentation

## 2023-08-05 DIAGNOSIS — Z122 Encounter for screening for malignant neoplasm of respiratory organs: Secondary | ICD-10-CM | POA: Insufficient documentation

## 2023-08-22 ENCOUNTER — Encounter: Payer: Self-pay | Admitting: Urology

## 2023-08-22 ENCOUNTER — Encounter: Payer: Self-pay | Admitting: *Deleted

## 2023-09-25 ENCOUNTER — Encounter: Payer: Self-pay | Admitting: Urology

## 2023-09-25 ENCOUNTER — Ambulatory Visit: Admitting: Urology

## 2023-09-25 VITALS — BP 126/76 | HR 103 | Ht 68.0 in | Wt 210.0 lb

## 2023-09-25 DIAGNOSIS — R399 Unspecified symptoms and signs involving the genitourinary system: Secondary | ICD-10-CM

## 2023-09-25 LAB — MICROSCOPIC EXAMINATION

## 2023-09-25 MED ORDER — SILODOSIN 8 MG PO CAPS: 8.0000 mg | ORAL_CAPSULE | Freq: Every day | ORAL | 0 refills | Status: DC

## 2023-09-25 NOTE — Progress Notes (Unsigned)
   09/25/23  CC:  Chief Complaint  Patient presents with   Cysto    HPI: Continues with intermittent burning sensation and decreased urinary stream.  UA 3-10 RBC  Blood pressure 126/76, pulse (!) 103, height 5' 8 (1.727 m), weight 210 lb (95.3 kg). NED. A&Ox3.   No respiratory distress   Abd soft, NT, ND Normal phallus with bilateral descended testicles  Cystoscopy Procedure Note  Patient identification was confirmed, informed consent was obtained, and patient was prepped using Betadine solution.  Lidocaine  jelly was administered per urethral meatus.     Pre-Procedure: - Inspection reveals a normal caliber urethral meatus.  Procedure: The flexible cystoscope was introduced without difficulty - No urethral strictures/lesions are present. - Moderate lateral lobe enlargement prostate  - Normal bladder neck - Bilateral ureteral orifices identified - Bladder mucosa  reveals no ulcers, tumors, or lesions - No bladder stones - Mild trabeculation  Retroflexion shows no abnormalities   Post-Procedure: - Patient tolerated the procedure well  Assessment/ Plan: Moderate prostate enlargement Hold tamsulosin ; trial silodosin  8 mg daily Call back or send MyChart message ~1 month regarding efficacy   Norman JAYSON Barba, MD

## 2023-09-26 LAB — URINALYSIS, COMPLETE
Bilirubin, UA: NEGATIVE
Ketones, UA: NEGATIVE
Leukocytes,UA: NEGATIVE
Nitrite, UA: NEGATIVE
Protein,UA: NEGATIVE
Specific Gravity, UA: 1.015 (ref 1.005–1.030)
Urobilinogen, Ur: 0.2 mg/dL (ref 0.2–1.0)
pH, UA: 7 (ref 5.0–7.5)

## 2023-09-26 LAB — MICROSCOPIC EXAMINATION

## 2023-10-11 ENCOUNTER — Other Ambulatory Visit: Admitting: Urology

## 2023-10-19 ENCOUNTER — Other Ambulatory Visit: Payer: Self-pay | Admitting: Urology

## 2024-01-17 ENCOUNTER — Encounter: Payer: Self-pay | Admitting: Urology
